# Patient Record
Sex: Male | Born: 1980 | Race: White | Hispanic: No | Marital: Single | Smoking: Former smoker
Health system: Southern US, Community
[De-identification: ages and names within clinical notes are randomized; demographics above are authoritative.]

## PROBLEM LIST (undated history)

## (undated) DIAGNOSIS — I10 Essential (primary) hypertension: Secondary | ICD-10-CM

## (undated) DIAGNOSIS — N189 Chronic kidney disease, unspecified: Secondary | ICD-10-CM

## (undated) HISTORY — PX: COCHLEAR IMPLANT: SUR684

---
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
## 2007-10-26 DIAGNOSIS — E291 Testicular hypofunction: Secondary | ICD-10-CM | POA: Insufficient documentation

## 2012-06-10 ENCOUNTER — Ambulatory Visit: Payer: Self-pay | Admitting: Unknown Physician Specialty

## 2014-03-30 HISTORY — PX: WISDOM TOOTH EXTRACTION: SHX21

## 2014-08-04 ENCOUNTER — Other Ambulatory Visit: Payer: Self-pay | Admitting: Rheumatology

## 2014-08-04 DIAGNOSIS — M25559 Pain in unspecified hip: Secondary | ICD-10-CM

## 2014-08-04 DIAGNOSIS — M533 Sacrococcygeal disorders, not elsewhere classified: Secondary | ICD-10-CM

## 2014-08-07 ENCOUNTER — Ambulatory Visit
Admission: RE | Admit: 2014-08-07 | Discharge: 2014-08-07 | Disposition: A | Discharge status: Home / Self Care | Payer: BLUE CROSS/BLUE SHIELD | Source: Ambulatory Visit | Attending: Rheumatology | Admitting: Rheumatology

## 2014-08-07 DIAGNOSIS — M533 Sacrococcygeal disorders, not elsewhere classified: Secondary | ICD-10-CM

## 2014-08-07 MED ORDER — IOHEXOL 300 MG/ML  SOLN
100.0000 mL | Freq: Once | INTRAMUSCULAR | Status: AC | PRN
  Administered 2014-08-07: 100 mL via INTRAVENOUS

## 2014-11-22 LAB — CBC AND DIFFERENTIAL
HCT: 43 % (ref 41–53)
HEMOGLOBIN: 14.9 g/dL (ref 13.5–17.5)
Neutrophils Absolute: 4 /uL
Platelets: 281 10*3/uL (ref 150–399)
WBC: 8.3 10^3/mL

## 2014-11-22 LAB — BASIC METABOLIC PANEL
BUN: 13 mg/dL (ref 4–21)
Creatinine: 1.1 mg/dL (ref 0.6–1.3)
Glucose: 95 mg/dL
Potassium: 4.6 mmol/L (ref 3.4–5.3)
SODIUM: 139 mmol/L (ref 137–147)

## 2014-11-22 LAB — LIPID PANEL
Cholesterol: 216 mg/dL — AB (ref 0–200)
HDL: 36 mg/dL (ref 35–70)
LDL Cholesterol: 141 mg/dL
LDL/HDL RATIO: 3.9
TRIGLYCERIDES: 195 mg/dL — AB (ref 40–160)

## 2014-11-22 LAB — HEPATIC FUNCTION PANEL
ALT: 52 U/L — AB (ref 10–40)
AST: 36 U/L (ref 14–40)
Alkaline Phosphatase: 119 U/L (ref 25–125)

## 2014-11-22 LAB — TSH: TSH: 2.36 u[IU]/mL (ref 0.41–5.90)

## 2015-02-08 DIAGNOSIS — G4730 Sleep apnea, unspecified: Secondary | ICD-10-CM | POA: Insufficient documentation

## 2015-02-08 DIAGNOSIS — J309 Allergic rhinitis, unspecified: Secondary | ICD-10-CM | POA: Insufficient documentation

## 2015-02-14 ENCOUNTER — Ambulatory Visit (INDEPENDENT_AMBULATORY_CARE_PROVIDER_SITE_OTHER): Payer: BLUE CROSS/BLUE SHIELD | Admitting: Family Medicine

## 2015-02-14 VITALS — Temp 98.6°F | Wt 204.0 lb

## 2015-02-14 DIAGNOSIS — Q8901 Asplenia (congenital): Secondary | ICD-10-CM | POA: Diagnosis not present

## 2015-02-14 DIAGNOSIS — Z23 Encounter for immunization: Secondary | ICD-10-CM | POA: Diagnosis not present

## 2015-02-14 NOTE — Progress Notes (Signed)
Patient ID: Jeremiah [name], male   DOB: Oct 20, 1980, 34 y.o.   MRN: [id number]   Jeremiah [name]  MRN: [id number] DOB: 05/01/1981  Subjective:  HPI   1. Asplenia Patient is a 34 year old male who  Presents today for an update of his immunizations.  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  He has already had 1 Pneumovax and 1 Menactra.  He is here today to Prevnar, Bexsero-Men B and Menveo,  He is afebrile and has not had any sign of illness or infection in the last week.  Patient Active Problem List   Diagnosis Date Noted  . Allergic rhinitis 02/08/2015  . Apnea, sleep 02/08/2015  . 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  Social History   Social History  . Marital Status: Single    Spouse Name: N/A  . Number of Children: N/A  . Years of Education: N/A   Occupational History  . Not on file.   Social History Main Topics  . Smoking status: Not on file  . Smokeless tobacco: Not on file  . Alcohol Use: Not on file  . Drug Use: Not on file  . Sexual Activity: Not on file   Other Topics Concern  . Not on file   Social History Narrative  . No narrative on file    Outpatient Prescriptions Prior to Visit  Medication Sig Dispense Refill  . fluticasone (FLONASE) 50 MCG/ACT nasal spray Place into the nose.    . loratadine (CLARITIN) 10 MG tablet Take by mouth.     No facility-administered medications prior to visit.    Allergies  Allergen Reactions  . Aspirin     Review of Systems  Constitutional: Negative for fever, chills and malaise/fatigue.  HENT: Negative for sore throat.   Respiratory: Negative for cough.   Cardiovascular: Negative for chest pain.  Gastrointestinal: Negative for abdominal pain.   Objective:  Temp(Src) 98.6 F (37 C)  Wt 204 lb (92.534 kg)  Physical Exam  Constitutional: He is well-developed, well-nourished, and in no distress.    Assessment and Plan :   1. Asplenia  - Meningococcal B, OMV -  Meningococcal conjugate vaccine 4-valent IM - Pneumococcal conjugate vaccine 13-valent IM  Patient is to receive Pneumovax in 1 year and repeat Meningitis in 5 years.   Julieanne Manson MD [facility] 02/14/2015 1:58 PM

## 2015-03-14 ENCOUNTER — Ambulatory Visit (INDEPENDENT_AMBULATORY_CARE_PROVIDER_SITE_OTHER): Payer: BLUE CROSS/BLUE SHIELD | Admitting: Family Medicine

## 2015-03-14 ENCOUNTER — Encounter: Payer: Self-pay | Admitting: Family Medicine

## 2015-03-14 VITALS — BP 122/72 | HR 84 | Temp 97.8°F | Resp 16 | Wt 203.0 lb

## 2015-03-14 DIAGNOSIS — L03221 Cellulitis of neck: Secondary | ICD-10-CM

## 2015-03-14 DIAGNOSIS — L255 Unspecified contact dermatitis due to plants, except food: Secondary | ICD-10-CM

## 2015-03-14 DIAGNOSIS — B029 Zoster without complications: Secondary | ICD-10-CM | POA: Diagnosis not present

## 2015-03-14 MED ORDER — MOMETASONE FUROATE 0.1 % EX CREA: 1.0000 "application " | TOPICAL_CREAM | Freq: Every day | CUTANEOUS | Status: DC

## 2015-03-14 MED ORDER — VALACYCLOVIR HCL 1 G PO TABS: 1000.0000 mg | ORAL_TABLET | Freq: Three times a day (TID) | ORAL | Status: DC

## 2015-03-14 MED ORDER — AMOXICILLIN 500 MG PO CAPS: ORAL_CAPSULE | ORAL | Status: DC

## 2015-03-14 NOTE — Progress Notes (Signed)
Patient ID: Jeremiah [name], male   DOB: 04-08-1981, 34 y.o.   MRN: [id number]    Subjective:  HPI  Patient is here for an acute issue. Patient states he developed a rash on the left side of his neck yesterday September 13. Rash is raised, red, and stings.  He has applied cortisone cream to the area.  Prior to Admission medications   Medication Sig Start Date End Date Taking? Authorizing Provider  fluticasone (FLONASE) 50 MCG/ACT nasal spray Place into the nose. 10/26/13  Yes Historical Provider, MD  loratadine (CLARITIN) 10 MG tablet Take by mouth.   Yes Historical Provider, MD    Patient Active Problem List   Diagnosis Date Noted  . Allergic rhinitis 02/08/2015  . Apnea, sleep 02/08/2015  . 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  Social History   Social History  . Marital Status: Single    Spouse Name: N/A  . Number of Children: N/A  . Years of Education: N/A   Occupational History  . Not on file.   Social History Main Topics  . Smoking status: Never Smoker   . Smokeless tobacco: Never Used  . Alcohol Use: No  . Drug Use: No  . Sexual Activity: Not on file   Other Topics Concern  . Not on file   Social History Narrative    Allergies  Allergen Reactions  . Aspirin     Review of Systems  Constitutional: Negative.   HENT: Positive for congestion.   Respiratory: Negative.   Cardiovascular: Negative.   Gastrointestinal: Negative.   Musculoskeletal: Negative.   Skin: Positive for rash.  Neurological: Negative.   Psychiatric/Behavioral: Negative.     Immunization History  Administered Date(s) Administered  . Meningococcal B, OMV 02/14/2015  . Meningococcal Conjugate 07/17/2010, 09/18/2010, 02/14/2015  . Pneumococcal Conjugate-13 02/14/2015  . Tdap 07/17/2010   Objective:  BP 122/72 mmHg  Pulse 84  Temp(Src) 97.8 F (36.6 C)  Resp 16  Wt 203 lb (92.08 kg)  Physical Exam  Constitutional: He is oriented to person,  place, and time and well-developed, well-nourished, and in no distress.  HENT:  Head: Normocephalic and atraumatic.  Right Ear: External ear normal.  Left Ear: External ear normal.  Nose: Nose normal.  Eyes: Conjunctivae are normal.  Neck: Neck supple.  Cardiovascular: Normal rate, regular rhythm and normal heart sounds.   Pulmonary/Chest: Effort normal and breath sounds normal.  Abdominal: Soft.  Neurological: He is alert and oriented to person, place, and time.  Skin: Skin is warm and dry.  Small, 1 cm x 0.5 cm area of dermatitis with 3 or 4 small vesicles. Mildly erythematous and scaling  Psychiatric: Mood, memory, affect and judgment normal.    Lab Results  Component Value Date   WBC 8.3 11/22/2014   HGB 14.9 11/22/2014   HCT 43 11/22/2014   PLT 281 11/22/2014   CHOL 216* 11/22/2014   TRIG 195* 11/22/2014   HDL 36 11/22/2014   LDLCALC 141 11/22/2014   TSH 2.36 11/22/2014    CMP     Component Value Date/Time   NA 139 11/22/2014   K 4.6 11/22/2014   BUN 13 11/22/2014   CREATININE 1.1 11/22/2014   AST 36 11/22/2014   ALT 52* 11/22/2014   ALKPHOS 119 11/22/2014    Assessment and Plan :  1. Rhus dermatitis Most likely - mometasone (ELOCON) 0.1 % cream; Apply 1 application topically daily.  Dispense: 15 g; Refill: 0  2. Herpes zoster Clinically this is not shingles. Only fil this prescription if symptoms more consistent with shingles - valACYclovir (VALTREX) 1000 MG tablet; Take 1 tablet (1,000 mg total) by mouth 3 (three) times daily.  Dispense: 21 tablet; Refill: 0  3. Cellulitis of neck This certainly does not appear to be an infection of any cannabis time. Only fill this prescription if he gets fever and worsening rash. He will call me if this happens. He is cover for this because of his asplenic condition - amoxicillin (AMOXIL) 500 MG capsule; 2 tablets twice daily  Dispense: 40 capsule; Refill: 0   Julieanne Manson MD [facility] 03/14/2015 8:48 AM

## 2015-03-28 ENCOUNTER — Telehealth: Payer: Self-pay | Admitting: Family Medicine

## 2015-03-28 NOTE — Telephone Encounter (Signed)
Per [facility] needs Pneumovax in 1 year and Meningitis in 5 years. She was wondering for Jeremiah [name] also and per Hettie Holstein looked like it was the same, but mother thought patient said in 1 month. Can you review this for me please to make sure I am telling them the right information, she said no need to call her back unless we find out this is not accurate, thank you-aa

## 2015-03-28 NOTE — Telephone Encounter (Signed)
I have discussed with the mother all the vaccines they are due and what vaccines we are missing from our documentation.  Jeremiah [name] is going to get me that information and we will go from there.  [employer]

## 2015-03-28 NOTE — Telephone Encounter (Signed)
Pt's mom Claris Gower would like a call back to advise when pt is supposed to return to get his next round of shots. She stated that he got then last on 02/14/15. Mom is on DPR.  Please advise. Thanks TNP

## 2015-04-18 ENCOUNTER — Telehealth: Payer: Self-pay | Admitting: Family Medicine

## 2015-04-18 NOTE — Telephone Encounter (Signed)
Pt mother called and request a call back from [place].  CB#[phone]/MW

## 2015-04-30 ENCOUNTER — Ambulatory Visit (INDEPENDENT_AMBULATORY_CARE_PROVIDER_SITE_OTHER): Payer: BLUE CROSS/BLUE SHIELD | Admitting: Family Medicine

## 2015-04-30 VITALS — Temp 99.3°F

## 2015-04-30 DIAGNOSIS — Z23 Encounter for immunization: Secondary | ICD-10-CM

## 2015-04-30 DIAGNOSIS — Q8901 Asplenia (congenital): Secondary | ICD-10-CM

## 2015-05-01 DIAGNOSIS — Q8901 Asplenia (congenital): Secondary | ICD-10-CM | POA: Diagnosis not present

## 2015-05-01 DIAGNOSIS — Z23 Encounter for immunization: Secondary | ICD-10-CM | POA: Diagnosis not present

## 2015-05-01 LAB — MEASLES/MUMPS/RUBELLA IMMUNITY
MUMPS ABS, IGG: 42.3 AU/mL (ref >10.9)
RUBELLA: 1.18 {index} (ref >0.99)

## 2015-05-01 LAB — VARICELLA ZOSTER ABS, IGG/IGM: Varicella zoster IgG: >4000 index (ref >165)

## 2015-06-05 ENCOUNTER — Ambulatory Visit (INDEPENDENT_AMBULATORY_CARE_PROVIDER_SITE_OTHER): Payer: BLUE CROSS/BLUE SHIELD | Admitting: Family Medicine

## 2015-06-05 VITALS — Temp 98.6°F

## 2015-06-05 DIAGNOSIS — Z23 Encounter for immunization: Secondary | ICD-10-CM | POA: Diagnosis not present

## 2015-07-26 NOTE — Progress Notes (Signed)
Vaccine needed

## 2015-12-20 DIAGNOSIS — Z45321 Encounter for adjustment and management of cochlear device: Secondary | ICD-10-CM | POA: Diagnosis not present

## 2015-12-20 DIAGNOSIS — H903 Sensorineural hearing loss, bilateral: Secondary | ICD-10-CM | POA: Diagnosis not present

## 2016-04-14 DIAGNOSIS — L400 Psoriasis vulgaris: Secondary | ICD-10-CM | POA: Diagnosis not present

## 2016-05-13 ENCOUNTER — Ambulatory Visit (INDEPENDENT_AMBULATORY_CARE_PROVIDER_SITE_OTHER): Payer: BLUE CROSS/BLUE SHIELD | Admitting: Family Medicine

## 2016-05-13 VITALS — BP 128/74 | HR 74 | Temp 98.6°F | Resp 18 | Wt 200.0 lb

## 2016-05-13 DIAGNOSIS — J329 Chronic sinusitis, unspecified: Secondary | ICD-10-CM | POA: Diagnosis not present

## 2016-05-13 MED ORDER — AMOXICILLIN-POT CLAVULANATE 875-125 MG PO TABS: 1.0000 | ORAL_TABLET | Freq: Two times a day (BID) | ORAL | 3 refills | Status: DC

## 2016-05-13 NOTE — Progress Notes (Signed)
Jeremiah [name]  MRN: [id number] DOB: 06/19/1981  Subjective:  HPI  The patient is a 35 year old male who presents for evaluation of cold symptoms.  He describes having a non productive cough, head congestion, fever no sore throat.  The cough is making his chest sorf and it is also keeping him up at night.  Patient Active Problem List   Diagnosis Date Noted  . Allergic rhinitis 02/08/2015  . Apnea, sleep 02/08/2015  . 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  Social History   Social History  . Marital status: Single    Spouse name: N/A  . Number of children: N/A  . Years of education: N/A   Occupational History  . Not on file.   Social History Main Topics  . Smoking status: Never Smoker  . Smokeless tobacco: Never Used  . Alcohol use No  . Drug use: No  . Sexual activity: Not on file   Other Topics Concern  . Not on file   Social History Narrative  . No narrative on file    Outpatient Encounter Prescriptions as of 05/13/2016  Medication Sig Note  . [DISCONTINUED] fluticasone (FLONASE) 50 MCG/ACT nasal spray Place into the nose. 02/08/2015: Received from: [facility]  . [DISCONTINUED] loratadine (CLARITIN) 10 MG tablet Take by mouth. 02/08/2015: Received from: [facility]  . [DISCONTINUED] mometasone (ELOCON) 0.1 % cream Apply 1 application topically daily.   . [DISCONTINUED] valACYclovir (VALTREX) 1000 MG tablet Take 1 tablet (1,000 mg total) by mouth 3 (three) times daily.   . [DISCONTINUED] amoxicillin (AMOXIL) 500 MG capsule 2 tablets twice daily    No facility-administered encounter medications on file as of 05/13/2016.     Allergies  Allergen Reactions  . Aspirin     Review of Systems  Constitutional: Positive for chills, diaphoresis, fever and malaise/fatigue.  HENT: Positive for congestion and hearing loss (chronic). Negative for ear discharge, ear pain, nosebleeds, sinus pain, sore  throat and tinnitus.   Eyes: Negative for blurred vision, double vision, photophobia, pain, discharge and redness.  Respiratory: Positive for cough. Negative for hemoptysis, sputum production, shortness of breath and wheezing.   Cardiovascular: Negative for chest pain, palpitations and orthopnea.  Neurological: Positive for dizziness and weakness. Negative for headaches.  Psychiatric/Behavioral: Negative.     Objective:  BP 128/74 (BP Location: Right Arm, Patient Position: Sitting, Cuff Size: Normal)   Pulse 74   Temp 98.6 F (37 C) (Oral)   Resp 18   Wt 200 lb (90.7 kg)   SpO2 96%   BMI 27.89 kg/m   Physical Exam  Constitutional: He is oriented to person, place, and time and well-developed, well-nourished, and in no distress.  HENT:  Head: Normocephalic and atraumatic.  Right Ear: External ear normal.  Left Ear: External ear normal.  Nose: Nose normal.  Mouth/Throat: Oropharynx is clear and moist. No oropharyngeal exudate.  Eyes: Conjunctivae are normal. No scleral icterus.  Neck: Neck supple. No thyromegaly present.  Cardiovascular: Normal rate, regular rhythm and normal heart sounds.   Pulmonary/Chest: Effort normal and breath sounds normal.  Abdominal: Soft. Bowel sounds are normal.  Lymphadenopathy:    He has no cervical adenopathy.  Neurological: He is alert and oriented to person, place, and time. Gait normal.  Skin: Skin is warm and dry. No rash noted.  Psychiatric: Mood, memory, affect and judgment normal.    Assessment and Plan :  Asplenia  Bronchitis Rx aggressively in  asplenic patient.Augmentin.  I have done the exam and reviewed the chart and it is accurate to the best of my knowledge. [employer]  technology has been used and  any errors in dictation or transcription are unintentional. Julieanne Mansonichard Shadeed Colberg M.D. [facility]

## 2016-06-03 DIAGNOSIS — Z23 Encounter for immunization: Secondary | ICD-10-CM | POA: Diagnosis not present

## 2016-07-22 DIAGNOSIS — L400 Psoriasis vulgaris: Secondary | ICD-10-CM | POA: Diagnosis not present

## 2016-08-06 ENCOUNTER — Telehealth: Payer: Self-pay | Admitting: Family Medicine

## 2016-08-06 NOTE — Telephone Encounter (Signed)
Patient has not been exposed and is not having any symptoms, mother was just concerned and asking questions. ED

## 2016-08-06 NOTE — Telephone Encounter (Signed)
Pt's mom Charlotte would like Jeremiah [name] to return her call. Charlotte is concerned with pt's immune system she would like to have an Rx for Tamaflu sent to [facility] just in case. Charlotte stated she would just feel better if there where to start showing signs the Rx would be at the pharmacy in case it was after clinic hours or over the weekend. Please advise. Thanks TNP   °

## 2016-10-16 ENCOUNTER — Ambulatory Visit (INDEPENDENT_AMBULATORY_CARE_PROVIDER_SITE_OTHER): Payer: BLUE CROSS/BLUE SHIELD | Admitting: Family Medicine

## 2016-10-16 ENCOUNTER — Encounter: Payer: Self-pay | Admitting: Family Medicine

## 2016-10-16 VITALS — BP 124/70 | HR 74 | Temp 97.8°F | Resp 16 | Ht 71.0 in | Wt 200.0 lb

## 2016-10-16 DIAGNOSIS — J301 Allergic rhinitis due to pollen: Secondary | ICD-10-CM | POA: Diagnosis not present

## 2016-10-16 DIAGNOSIS — Z0000 Encounter for general adult medical examination without abnormal findings: Secondary | ICD-10-CM

## 2016-10-16 DIAGNOSIS — Z23 Encounter for immunization: Secondary | ICD-10-CM | POA: Diagnosis not present

## 2016-10-16 DIAGNOSIS — Q8901 Asplenia (congenital): Secondary | ICD-10-CM

## 2016-10-16 DIAGNOSIS — R319 Hematuria, unspecified: Secondary | ICD-10-CM | POA: Diagnosis not present

## 2016-10-16 LAB — POCT URINALYSIS DIPSTICK
BILIRUBIN UA: NEGATIVE
Glucose, UA: NEGATIVE
KETONES UA: NEGATIVE
Leukocytes, UA: NEGATIVE
Nitrite, UA: NEGATIVE
PH UA: 5 (ref 5.0–8.0)
Protein, UA: 100
Spec Grav, UA: 1.02 (ref 1.010–1.025)
Urobilinogen, UA: 0.2 E.U./dL

## 2016-10-16 MED ORDER — MONTELUKAST SODIUM 10 MG PO TABS
10.0000 mg | ORAL_TABLET | Freq: Every day | ORAL | 12 refills | Status: DC
Start: 2016-10-16 — End: 2018-09-07

## 2016-10-16 MED ORDER — FLUTICASONE PROPIONATE 50 MCG/ACT NA SUSP
2.0000 | Freq: Every day | NASAL | 12 refills | Status: DC
Start: 2016-10-16 — End: 2020-10-01

## 2016-10-16 NOTE — Progress Notes (Signed)
Patient: Jeremiah [name], Male    DOB: Oct 03, 1980, 36 y.o.   MRN: [id number] Visit Date: 10/16/2016  Today's Provider: Wilhemena Durie, MD   Chief Complaint  Patient presents with  . Annual Exam   Subjective:    Annual physical exam Jeremiah [name] is a 36 y.o. male who presents today for health maintenance and complete physical. He feels well. He reports exercising at least once a week. He reports he is sleeping well.  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   Immunization History  Administered Date(s) Administered  . DTaP 05/08/1981, 08/13/1981, 10/26/1981, 11/21/1982, 02/12/1997  . Hepatitis A 04/30/2015  . Hepatitis B 04/30/2015  . Hepatitis B, adult 06/05/2015  . HiB (PRP-OMP) 07/14/1984  . IPV 05/08/1981, 08/13/1981, 10/26/1981, 11/21/1982, 02/12/1997  . Influenza,inj,Quad PF,36+ Mos 05/01/2015  . MMR 08/15/1982  . Meningococcal B, OMV 02/14/2015, 04/30/2015  . Meningococcal Conjugate 07/17/2010, 09/18/2010, 02/14/2015  . Pneumococcal Conjugate-13 02/14/2015  . Pneumococcal Polysaccharide-23 07/03/2005  . Tdap 07/17/2010     Review of Systems  Constitutional: Negative.   HENT: Negative.   Eyes: Negative.   Respiratory: Negative.   Cardiovascular: Negative.   Gastrointestinal: Negative.   Endocrine: Negative.   Genitourinary: Negative.   Musculoskeletal: Negative.   Skin: Negative.   Allergic/Immunologic: Positive for environmental allergies.  Neurological: Negative.   Hematological: Negative.   Psychiatric/Behavioral: Negative.     Social History      He  reports that he has never smoked. He has never used smokeless tobacco. He reports that he does not drink alcohol or use drugs.       Social History   Social History  . Marital status: Single    Spouse name: N/A  . Number of children: N/A  . Years of education: N/A   Social History Main Topics  . Smoking  status: Never Smoker  . Smokeless tobacco: Never Used  . Alcohol use No  . Drug use: No  . Sexual activity: Not Asked   Other Topics Concern  . None   Social History Narrative  . None    History reviewed. No pertinent past medical history.   Patient Active Problem List   Diagnosis Date Noted  . Allergic rhinitis 02/08/2015  . Apnea, sleep 02/08/2015  . 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 COCHLEAR IMPLANT     2000 and 2007  . SPLENECTOMY  1988  . WISDOM TOOTH EXTRACTION  03/2014    Family History        Family Status  Relation Status  . Mother Alive  . Father Alive  . Brother Alive        His family history includes Hyperlipidemia in his father.     Allergies  Allergen Reactions  . Aspirin     No current outpatient prescriptions on file.   Patient Care Team: Jerrol Banana., MD as PCP - General (Family Medicine)      Objective:   Vitals: BP 124/70 (BP Location: Left Arm, Patient Position: Sitting, Cuff Size: Large)   Pulse 74   Temp 97.8 F (36.6 C) (Oral)   Resp 16   Ht 5' 11"  (1.803 m)   Wt 200 lb (90.7 kg)   BMI 27.89 kg/m    Vitals:   10/16/16 0947  BP: 124/70  Pulse: 74  Resp: 16  Temp: 97.8 F (36.6 C)  TempSrc: Oral  Weight: 200 lb (90.7 kg)  Height: 5' 11"  (1.803 m)     Physical Exam  Constitutional: He is oriented to person, place, and time. He appears well-developed and well-nourished.  HENT:  Head: Normocephalic and atraumatic.  Right Ear: External ear normal.  Left Ear: External ear normal.  Nose: Nose normal.  Mouth/Throat: Oropharynx is clear and moist.  1+ tonsils. New goiter.  Eyes: Conjunctivae and EOM are normal. Pupils are equal, round, and reactive to light.  Neck: Normal range of motion. Neck supple.  Cardiovascular: Normal rate, regular rhythm, normal heart sounds and intact distal pulses.   Pulmonary/Chest: Effort normal and breath sounds normal.    Abdominal: Soft. Bowel sounds are normal.  Musculoskeletal: Normal range of motion.  Neurological: He is alert and oriented to person, place, and time. He has normal reflexes.  Skin: Skin is warm and dry.  Psychiatric: He has a normal mood and affect. His behavior is normal. Judgment and thought content normal.     Depression Screen PHQ 2/9 Scores 10/16/2016 03/14/2015  PHQ - 2 Score 0 0  PHQ- 9 Score 0 -      Assessment & Plan:     Routine Health Maintenance and Physical Exam  Exercise Activities and Dietary recommendations Goals    None      Immunization History  Administered Date(s) Administered  . DTaP 05/08/1981, 08/13/1981, 10/26/1981, 11/21/1982, 02/12/1997  . Hepatitis A 04/30/2015  . Hepatitis B 04/30/2015  . Hepatitis B, adult 06/05/2015  . HiB (PRP-OMP) 07/14/1984  . IPV 05/08/1981, 08/13/1981, 10/26/1981, 11/21/1982, 02/12/1997  . Influenza,inj,Quad PF,36+ Mos 05/01/2015  . MMR 08/15/1982  . Meningococcal B, OMV 02/14/2015, 04/30/2015  . Meningococcal Conjugate 07/17/2010, 09/18/2010, 02/14/2015  . Pneumococcal Conjugate-13 02/14/2015  . Pneumococcal Polysaccharide-23 07/03/2005  . Tdap 07/17/2010    Health Maintenance  Topic Date Due  . HIV Screening  03/08/1996  . INFLUENZA VACCINE  01/28/2017  . TETANUS/TDAP  07/17/2020     Discussed health benefits of physical activity, and encouraged him to engage in regular exercise appropriate for his age and condition.  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 Small Anal Fissure New Goiter Get thyroid [place] Restart CPAP.   --------------------------------------------------------------------    RWilhemena Durie MD  [facility]

## 2016-10-17 DIAGNOSIS — Z0000 Encounter for general adult medical examination without abnormal findings: Secondary | ICD-10-CM | POA: Diagnosis not present

## 2016-10-18 LAB — COMPREHENSIVE METABOLIC PANEL
ALBUMIN: 4.3 g/dL (ref 3.5–5.5)
ALK PHOS: 111 IU/L (ref 39–117)
ALT: 44 IU/L (ref 0–44)
AST: 38 IU/L (ref 0–40)
Albumin/Globulin Ratio: 1.7 (ref 1.2–2.2)
BUN / CREAT RATIO: 12 (ref 9–20)
BUN: 13 mg/dL (ref 6–20)
Bilirubin Total: 0.4 mg/dL (ref 0.0–1.2)
CO2: 23 mmol/L (ref 18–29)
CREATININE: 1.1 mg/dL (ref 0.76–1.27)
Calcium: 9.1 mg/dL (ref 8.7–10.2)
Chloride: 101 mmol/L (ref 96–106)
GFR calc Af Amer: 100 mL/min/{1.73_m2} (ref >59)
GFR calc non Af Amer: 87 mL/min/{1.73_m2} (ref >59)
GLUCOSE: 97 mg/dL (ref 65–99)
Globulin, Total: 2.6 g/dL (ref 1.5–4.5)
Potassium: 4.3 mmol/L (ref 3.5–5.2)
Sodium: 139 mmol/L (ref 134–144)
Total Protein: 6.9 g/dL (ref 6.0–8.5)

## 2016-10-18 LAB — LIPID PANEL WITH LDL/HDL RATIO
CHOLESTEROL TOTAL: 215 mg/dL — AB (ref 100–199)
HDL: 29 mg/dL — ABNORMAL LOW (ref >39)
LDL CALC: 140 mg/dL — AB (ref 0–99)
LDl/HDL Ratio: 4.8 ratio — ABNORMAL HIGH (ref 0.0–3.6)
Triglycerides: 231 mg/dL — ABNORMAL HIGH (ref 0–149)
VLDL Cholesterol Cal: 46 mg/dL — ABNORMAL HIGH (ref 5–40)

## 2016-10-18 LAB — CBC WITH DIFFERENTIAL/PLATELET
BASOS ABS: 0.1 10*3/uL (ref 0.0–0.2)
Basos: 1 %
EOS (ABSOLUTE): 0.4 10*3/uL (ref 0.0–0.4)
Eos: 4 %
Hematocrit: 40.2 % (ref 37.5–51.0)
Hemoglobin: 14.6 g/dL (ref 13.0–17.7)
Immature Grans (Abs): 0 10*3/uL (ref 0.0–0.1)
Immature Granulocytes: 0 %
LYMPHS ABS: 2.3 10*3/uL (ref 0.7–3.1)
Lymphs: 24 %
MCH: 30.2 pg (ref 26.6–33.0)
MCHC: 36.3 g/dL — AB (ref 31.5–35.7)
MCV: 83 fL (ref 79–97)
Monocytes Absolute: 1.3 10*3/uL — ABNORMAL HIGH (ref 0.1–0.9)
Monocytes: 13 %
NEUTROS ABS: 5.5 10*3/uL (ref 1.4–7.0)
Neutrophils: 58 %
PLATELETS: 270 10*3/uL (ref 150–379)
RBC: 4.83 x10E6/uL (ref 4.14–5.80)
RDW: 14.6 % (ref 12.3–15.4)
WBC: 9.5 10*3/uL (ref 3.4–10.8)

## 2016-10-18 LAB — TSH: TSH: 2.17 u[IU]/mL (ref 0.450–4.500)

## 2016-10-23 NOTE — Progress Notes (Signed)
Advised  ED 

## 2016-10-31 LAB — POCT UA - MICROSCOPIC ONLY

## 2016-11-12 ENCOUNTER — Ambulatory Visit (INDEPENDENT_AMBULATORY_CARE_PROVIDER_SITE_OTHER): Payer: BLUE CROSS/BLUE SHIELD | Admitting: Physician Assistant

## 2016-11-12 ENCOUNTER — Encounter: Payer: Self-pay | Admitting: Physician Assistant

## 2016-11-12 VITALS — BP 112/72 | HR 92 | Temp 98.3°F | Resp 16 | Wt 199.0 lb

## 2016-11-12 DIAGNOSIS — Q8901 Asplenia (congenital): Secondary | ICD-10-CM | POA: Diagnosis not present

## 2016-11-12 DIAGNOSIS — J029 Acute pharyngitis, unspecified: Secondary | ICD-10-CM

## 2016-11-12 DIAGNOSIS — R509 Fever, unspecified: Secondary | ICD-10-CM

## 2016-11-12 LAB — POCT INFLUENZA A/B
Influenza A, POC: NEGATIVE
Influenza B, POC: NEGATIVE

## 2016-11-12 LAB — POCT RAPID STREP A (OFFICE): Rapid Strep A Screen: NEGATIVE

## 2016-11-12 MED ORDER — AMOXICILLIN-POT CLAVULANATE 875-125 MG PO TABS: 1.0000 | ORAL_TABLET | Freq: Two times a day (BID) | ORAL | 0 refills | Status: AC

## 2016-11-12 NOTE — Progress Notes (Signed)
Patient: Jeremiah [name] Male    DOB: [birth year]   36 y.o.   MRN: [id number] Visit Date: 11/12/2016  Today's Provider: Trey Sailors, PA-C   Chief Complaint  Patient presents with  . Sore Throat    Started Last night.  . Fever    101.4 today   Subjective:    Sore Throat   This is a new problem. The current episode started today. The problem has been unchanged. Neither side of throat is experiencing more pain than the other. The maximum temperature recorded prior to his arrival was 101 - 101.9 F. The fever has been present for less than 1 day. Associated symptoms include headaches and trouble swallowing. Pertinent negatives include no abdominal pain, congestion, coughing, diarrhea, drooling, ear discharge, ear pain, hoarse voice, plugged ear sensation, neck pain, shortness of breath, stridor, swollen glands or vomiting. He has tried NSAIDs for the symptoms. The treatment provided mild relief.  Fever   This is a new problem. The current episode started today. The maximum temperature noted was 101 to 101.9 F. Associated symptoms include headaches and a sore throat. Pertinent negatives include no abdominal pain, congestion, coughing, diarrhea, ear pain or vomiting. He has tried NSAIDs for the symptoms. The treatment provided moderate relief.   Jeremiah [name] is 36 y/o asplenic male with above symptoms. Not currently febrile in office, but has taken four 200 mg ibuprofen pills since noon today. Has had mono before very remotely.     Allergies  Allergen Reactions  . Aspirin      Current Outpatient Prescriptions:  .  fluticasone (FLONASE) 50 MCG/ACT nasal spray, Place 2 sprays into both nostrils daily., Disp: 15 g, Rfl: 12 .  montelukast (SINGULAIR) 10 MG tablet, Take 1 tablet (10 mg total) by mouth at bedtime., Disp: 30 tablet, Rfl: 12  Review of Systems  Constitutional: Positive for chills, diaphoresis, fatigue and fever. Negative for activity change, appetite change and  unexpected weight change.  HENT: Positive for sore throat and trouble swallowing. Negative for congestion, drooling, ear discharge, ear pain, hoarse voice, nosebleeds, postnasal drip, rhinorrhea, sinus pain, sinus pressure, tinnitus and voice change.   Eyes: Negative.   Respiratory: Negative.  Negative for cough, shortness of breath and stridor.   Cardiovascular: Negative.   Gastrointestinal: Negative.  Negative for abdominal pain, diarrhea and vomiting.  Musculoskeletal: Positive for arthralgias, myalgias and neck stiffness. Negative for back pain, gait problem, joint swelling and neck pain.  Neurological: Positive for headaches. Negative for dizziness and light-headedness.    Social History  Substance Use Topics  . Smoking status: Never Smoker  . Smokeless tobacco: Never Used  . Alcohol use No   Objective:   BP 112/72 (BP Location: Right Arm, Patient Position: Sitting, Cuff Size: Normal)   Pulse 92   Temp 98.3 F (36.8 C) (Oral)   Resp 16   Wt 199 lb (90.3 kg)   BMI 27.75 kg/m  Vitals:   11/12/16 1500  BP: 112/72  Pulse: 92  Resp: 16  Temp: 98.3 F (36.8 C)  TempSrc: Oral  Weight: 199 lb (90.3 kg)     Physical Exam  Constitutional: He appears well-developed and well-nourished. He appears ill. No distress.  HENT:  Right Ear: External ear normal. Tympanic membrane is not erythematous and not bulging.  Left Ear: Tympanic membrane and external ear normal. Tympanic membrane is not erythematous and not bulging.  Nose: Mucosal edema present.  Mouth/Throat: Posterior oropharyngeal edema and  posterior oropharyngeal erythema present. No oropharyngeal exudate or tonsillar abscesses.  Right erythematous ear canal but not TM. Cochlear implant on left side.   Eyes: Conjunctivae are normal.  Cardiovascular: Normal rate and regular rhythm.   Pulmonary/Chest: Effort normal and breath sounds normal.  Lymphadenopathy:    He has cervical adenopathy.  Skin: Skin is warm and dry.    Psychiatric: He has a normal mood and affect. His behavior is normal.        Assessment & Plan:     1. Pharyngitis, unspecified etiology  Rapid flu and strep negative. Will treat as below 2/2 asplenia. Wrote work note for next two days. Call back if not improving.  - amoxicillin-clavulanate (AUGMENTIN) 875-125 MG tablet; Take 1 tablet by mouth 2 (two) times daily.  Dispense: 14 tablet; Refill: 0 - POCT Influenza A/B - POCT rapid strep A  2. Asplenia  - amoxicillin-clavulanate (AUGMENTIN) 875-125 MG tablet; Take 1 tablet by mouth 2 (two) times daily.  Dispense: 14 tablet; Refill: 0  3. Fever, unspecified fever cause  - amoxicillin-clavulanate (AUGMENTIN) 875-125 MG tablet; Take 1 tablet by mouth 2 (two) times daily.  Dispense: 14 tablet; Refill: 0 - POCT Influenza A/B - POCT rapid strep A  Return if symptoms worsen or fail to improve.  The entirety of the information documented in the History of Present Illness, Review of Systems and Physical Exam were personally obtained by me. Portions of this information were initially documented by Kavin LeechLaura Walsh, CMA and reviewed by me for thoroughness and accuracy.          Trey SailorsAdriana M Pollak, PA-C  [facility]

## 2016-11-12 NOTE — Patient Instructions (Signed)

## 2016-11-17 ENCOUNTER — Encounter: Payer: Self-pay | Admitting: Family Medicine

## 2016-11-17 ENCOUNTER — Ambulatory Visit (INDEPENDENT_AMBULATORY_CARE_PROVIDER_SITE_OTHER): Payer: BLUE CROSS/BLUE SHIELD | Admitting: Family Medicine

## 2016-11-17 VITALS — BP 142/72 | HR 96 | Temp 98.3°F | Resp 16 | Wt 196.0 lb

## 2016-11-17 DIAGNOSIS — H6690 Otitis media, unspecified, unspecified ear: Secondary | ICD-10-CM | POA: Diagnosis not present

## 2016-11-17 DIAGNOSIS — M791 Myalgia, unspecified site: Secondary | ICD-10-CM

## 2016-11-17 MED ORDER — DOXYCYCLINE HYCLATE 100 MG PO TABS: 100.0000 mg | ORAL_TABLET | Freq: Two times a day (BID) | ORAL | 0 refills | Status: DC

## 2016-11-17 NOTE — Progress Notes (Signed)
       Patient: Jeremiah NettlesJacob H Iott Male    DOB: 04/09/1981   36 y.o.   MRN: [id number] Visit Date: 11/17/2016  Today's Provider: Megan Mansichard Gilbert Jr, MD   Chief Complaint  Patient presents with  . Ear Pain   Subjective:    HPI Patient comes in today c/o right ear pain. He reports that this has been ongoing for over 1 week. Patient was seen in the office last week and he was prescribed Augmentin.  He was also tested negative for strep and flu. He currently has 2 doses left. Patient reports that he has associated body aches and jaw pain. He has also been taking OTC sudafed along with his daily allergy medication.     Allergies  Allergen Reactions  . Aspirin      Current Outpatient Prescriptions:  .  amoxicillin-clavulanate (AUGMENTIN) 875-125 MG tablet, Take 1 tablet by mouth 2 (two) times daily., Disp: 14 tablet, Rfl: 0 .  fluticasone (FLONASE) 50 MCG/ACT nasal spray, Place 2 sprays into both nostrils daily., Disp: 15 g, Rfl: 12 .  montelukast (SINGULAIR) 10 MG tablet, Take 1 tablet (10 mg total) by mouth at bedtime., Disp: 30 tablet, Rfl: 12  Review of Systems  Constitutional: Positive for fatigue.  HENT: Positive for ear pain.   Respiratory: Negative.   Cardiovascular: Negative.   Musculoskeletal: Positive for myalgias.  Skin: Negative.   Allergic/Immunologic: Negative.   Hematological: Negative.   Psychiatric/Behavioral: Negative.     Social History  Substance Use Topics  . Smoking status: Never Smoker  . Smokeless tobacco: Never Used  . Alcohol use No   Objective:   BP (!) 142/72 (BP Location: Right Arm, Patient Position: Sitting, Cuff Size: Normal) Comment: pt has been taking Sudafed  Pulse 96   Temp 98.3 F (36.8 C)   Resp 16   Wt 196 lb (88.9 kg)   SpO2 99%   BMI 27.34 kg/m  Vitals:   11/17/16 1607  BP: (!) 142/72  Pulse: 96  Resp: 16  Temp: 98.3 F (36.8 C)  SpO2: 99%  Weight: 196 lb (88.9 kg)     Physical Exam  Constitutional: He is oriented to  person, place, and time. He appears well-developed and well-nourished.  HENT:  Head: Normocephalic and atraumatic.  Right Ear: Tympanic membrane is bulging.  Left Ear: External ear normal.  Nose: Nose normal.  Mouth/Throat: Oropharynx is clear and moist.  Neck: Normal range of motion.  Cardiovascular: Normal rate, regular rhythm and normal heart sounds.   Pulmonary/Chest: Effort normal and breath sounds normal.  Musculoskeletal: He exhibits tenderness.  Neurological: He is alert and oriented to person, place, and time.  Skin: Skin is warm and dry.  Psychiatric: He has a normal mood and affect. His behavior is normal. Judgment and thought content normal.        Assessment & Plan:     1. Myalgia Will change abx due to possible RMSF. Call if symptoms worsen.  - doxycycline (VIBRA-TABS) 100 MG tablet; Take 1 tablet (100 mg total) by mouth 2 (two) times daily.  Dispense: 14 tablet; Refill: 0  2. Acute otitis media, unspecified otitis media type  - doxycycline (VIBRA-TABS) 100 MG tablet; Take 1 tablet (100 mg total) by mouth 2 (two) times daily.  Dispense: 14 tablet; Refill: 0 3.Asplenia       Megan Mansichard Gilbert Jr, MD  [facility]

## 2016-11-19 ENCOUNTER — Telehealth: Payer: Self-pay | Admitting: Family Medicine

## 2016-11-19 DIAGNOSIS — R5081 Fever presenting with conditions classified elsewhere: Secondary | ICD-10-CM

## 2016-11-19 DIAGNOSIS — H9201 Otalgia, right ear: Secondary | ICD-10-CM

## 2016-11-19 NOTE — Telephone Encounter (Signed)
Mother advised. Also advised mother per Dr Sullivan LoneGilbert patient should take Augmentin and Doxy together and finish them both. patient will get lab work done today, and mother advised to let us know if patient gets worse or not better.=aa

## 2016-11-19 NOTE — Telephone Encounter (Signed)
Pt mom called states pt id have ear pain, body aches and a fever 99.7 this morning at 4am.  Mom is asking if pt will need to have lab work done. CB#[phone]/MW

## 2016-11-19 NOTE — Telephone Encounter (Signed)
Please review. Patient was seen on 11/17/16 and was put on Doxy tablets.-aa

## 2016-11-19 NOTE — Telephone Encounter (Signed)
Get CBC

## 2016-11-20 ENCOUNTER — Ambulatory Visit
Admission: RE | Admit: 2016-11-20 | Discharge: 2016-11-20 | Disposition: A | Discharge status: Home / Self Care | Payer: BLUE CROSS/BLUE SHIELD | Source: Ambulatory Visit | Attending: Family Medicine | Admitting: Family Medicine

## 2016-11-20 ENCOUNTER — Ambulatory Visit (INDEPENDENT_AMBULATORY_CARE_PROVIDER_SITE_OTHER): Payer: BLUE CROSS/BLUE SHIELD | Admitting: Family Medicine

## 2016-11-20 ENCOUNTER — Encounter: Payer: Self-pay | Admitting: Family Medicine

## 2016-11-20 ENCOUNTER — Telehealth: Payer: Self-pay

## 2016-11-20 VITALS — BP 126/74 | HR 84 | Temp 97.9°F | Resp 16 | Wt 196.0 lb

## 2016-11-20 DIAGNOSIS — R05 Cough: Secondary | ICD-10-CM

## 2016-11-20 DIAGNOSIS — R509 Fever, unspecified: Secondary | ICD-10-CM | POA: Diagnosis not present

## 2016-11-20 DIAGNOSIS — R059 Cough, unspecified: Secondary | ICD-10-CM

## 2016-11-20 DIAGNOSIS — R319 Hematuria, unspecified: Secondary | ICD-10-CM | POA: Diagnosis not present

## 2016-11-20 LAB — CBC WITH DIFFERENTIAL/PLATELET
BASOS: 0 %
Basophils Absolute: 0.1 10*3/uL (ref 0.0–0.2)
EOS (ABSOLUTE): 0.2 10*3/uL (ref 0.0–0.4)
Eos: 1 %
Hematocrit: 39.5 % (ref 37.5–51.0)
Hemoglobin: 13.9 g/dL (ref 13.0–17.7)
IMMATURE GRANS (ABS): 0.3 10*3/uL — AB (ref 0.0–0.1)
Immature Granulocytes: 2 %
LYMPHS: 14 %
Lymphocytes Absolute: 2.2 10*3/uL (ref 0.7–3.1)
MCH: 29.4 pg (ref 26.6–33.0)
MCHC: 35.2 g/dL (ref 31.5–35.7)
MCV: 84 fL (ref 79–97)
MONOCYTES: 13 %
Monocytes Absolute: 2 10*3/uL — ABNORMAL HIGH (ref 0.1–0.9)
Neutrophils Absolute: 11 10*3/uL — ABNORMAL HIGH (ref 1.4–7.0)
Neutrophils: 70 %
PLATELETS: 405 10*3/uL — AB (ref 150–379)
RBC: 4.73 x10E6/uL (ref 4.14–5.80)
RDW: 14.7 % (ref 12.3–15.4)
WBC: 15.8 10*3/uL — ABNORMAL HIGH (ref 3.4–10.8)

## 2016-11-20 MED ORDER — CEFTRIAXONE SODIUM 500 MG IJ SOLR
500.0000 mg | Freq: Once | INTRAMUSCULAR | Status: AC
  Administered 2016-11-20: 500 mg via INTRAMUSCULAR

## 2016-11-20 MED ORDER — CEFTRIAXONE SODIUM 1 G IJ SOLR: 1.0000 g | Freq: Once | INTRAMUSCULAR | Status: DC

## 2016-11-20 NOTE — Patient Instructions (Addendum)
Alternate Advil and Tylenol. Get labs tomorrow.  Push fluids.

## 2016-11-20 NOTE — Telephone Encounter (Signed)
Patient's mother advised and will come in at 11:45.

## 2016-11-20 NOTE — Telephone Encounter (Signed)
White count elevated some. Can eat come see me at either 1145 or 1:15

## 2016-11-20 NOTE — Telephone Encounter (Signed)
Please review labs? Thanks! 

## 2016-11-20 NOTE — Progress Notes (Signed)
       Patient: Jeremiah [name] Male    DOB: 05/30/1981   35 y.o.   MRN: [id number] Visit Date: 11/20/2016  Today's Provider: Megan Mansichard Shaundrea Carrigg Jr, MD   Chief Complaint  Patient presents with  . Follow-up   Subjective:    HPI Patient comes in today for a follow up. Patient was seen in the office on 11/17/16 (3 days ago) c/o muscle aches, jaw pain, and URI symptoms. Patient reports that he had a fever up to 100 last night. He has been taking Advil along with the Doxycycline that was prescribed with mild relief. Patient's labs indicated that his WBC's were elevated.     Allergies  Allergen Reactions  . Aspirin      Current Outpatient Prescriptions:  .  doxycycline (VIBRA-TABS) 100 MG tablet, Take 1 tablet (100 mg total) by mouth 2 (two) times daily., Disp: 14 tablet, Rfl: 0 .  fluticasone (FLONASE) 50 MCG/ACT nasal spray, Place 2 sprays into both nostrils daily., Disp: 15 g, Rfl: 12 .  montelukast (SINGULAIR) 10 MG tablet, Take 1 tablet (10 mg total) by mouth at bedtime., Disp: 30 tablet, Rfl: 12  Review of Systems  Social History  Substance Use Topics  . Smoking status: Never Smoker  . Smokeless tobacco: Never Used  . Alcohol use No   Objective:   BP 126/74 (BP Location: Left Arm, Patient Position: Sitting)   Pulse 84   Temp 97.9 F (36.6 C)   Resp 16   Wt 196 lb (88.9 kg)   SpO2 98%   BMI 27.34 kg/m  Vitals:   11/20/16 1158  BP: 126/74  Pulse: 84  Resp: 16  Temp: 97.9 F (36.6 C)  SpO2: 98%  Weight: 196 lb (88.9 kg)     Physical Exam  Constitutional: He appears well-developed and well-nourished.  HENT:  Head: Normocephalic and atraumatic.  Right Ear: External ear normal.  Left Ear: External ear normal.  Nose: Nose normal.  Mouth/Throat: Oropharynx is clear and moist.  Eyes:  Lateral left eye has erythema.   Cardiovascular: Normal rate, regular rhythm and normal heart sounds.   Pulmonary/Chest: Effort normal and breath sounds normal.  Skin: Skin is warm  and dry. No rash noted.        Assessment & Plan:     1. Febrile illness Administered a total of 1GM of Rocephin. Patient tolerated well.  - CK - cefTRIAXone (ROCEPHIN) injection 500 mg; Inject 500 mg into the muscle once. - cefTRIAXone (ROCEPHIN) injection 500 mg; Inject 500 mg into the muscle once. Clinically improving. 2. Cough  - CBC with Differential/Platelet - DG Chest 2 View; Future  3. Hematuria, unspecified type  - Urine culture - POCT urinalysis dipstick 4.Asplenia  Megan Mansichard Genevra Orne Jr, MD  [facility]

## 2016-11-20 NOTE — Telephone Encounter (Signed)
Patient's mother is requesting lab results. Mrs. Claris GowerCharlotte reports that pt is still complaining of muscle pain. Mrs. 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 CB# [phone].

## 2016-11-21 ENCOUNTER — Telehealth: Payer: Self-pay | Admitting: Family Medicine

## 2016-11-21 DIAGNOSIS — R509 Fever, unspecified: Secondary | ICD-10-CM | POA: Diagnosis not present

## 2016-11-21 DIAGNOSIS — R05 Cough: Secondary | ICD-10-CM | POA: Diagnosis not present

## 2016-11-21 NOTE — Telephone Encounter (Signed)
Would try Delsym first for cough

## 2016-11-21 NOTE — Telephone Encounter (Signed)
Pt mom called to ask if pt can get a Rx to help with his cough at night.  [facility]  CB#[phone]/MW  This is a pt of Dr Sullivan LoneGilbert and was seen in the office yesterday.

## 2016-11-21 NOTE — Telephone Encounter (Signed)
Please review-aa 

## 2016-11-21 NOTE — Telephone Encounter (Signed)
Mother advised-aa 

## 2016-11-22 ENCOUNTER — Ambulatory Visit: Payer: Self-pay | Admitting: Family Medicine

## 2016-11-22 LAB — PLEASE NOTE

## 2016-11-22 LAB — URINE CULTURE: ORGANISM ID, BACTERIA: NO GROWTH

## 2016-11-22 LAB — CBC WITH DIFFERENTIAL/PLATELET
BASOS ABS: 0 10*3/uL (ref 0.0–0.2)
Basos: 0 %
EOS (ABSOLUTE): 0.3 10*3/uL (ref 0.0–0.4)
Eos: 2 %
HEMOGLOBIN: 13.6 g/dL (ref 13.0–17.7)
Hematocrit: 39.9 % (ref 37.5–51.0)
Immature Grans (Abs): 0.1 10*3/uL (ref 0.0–0.1)
Immature Granulocytes: 1 %
LYMPHS ABS: 1.9 10*3/uL (ref 0.7–3.1)
LYMPHS: 12 %
MCH: 29.4 pg (ref 26.6–33.0)
MCHC: 34.1 g/dL (ref 31.5–35.7)
MCV: 86 fL (ref 79–97)
MONOCYTES: 9 %
Monocytes Absolute: 1.4 10*3/uL — ABNORMAL HIGH (ref 0.1–0.9)
NEUTROS ABS: 12.4 10*3/uL — AB (ref 1.4–7.0)
Neutrophils: 76 %
PLATELETS: 469 10*3/uL — AB (ref 150–379)
RBC: 4.63 x10E6/uL (ref 4.14–5.80)
RDW: 14.9 % (ref 12.3–15.4)
WBC: 16.2 10*3/uL — ABNORMAL HIGH (ref 3.4–10.8)

## 2016-11-22 LAB — CK: Total CK: 341 U/L — ABNORMAL HIGH (ref 24–204)

## 2016-11-26 LAB — POCT URINALYSIS DIPSTICK
Bilirubin, UA: NEGATIVE
GLUCOSE UA: NEGATIVE
Ketones, UA: NEGATIVE
Nitrite, UA: NEGATIVE
PROTEIN UA: NEGATIVE
Spec Grav, UA: 1.02 (ref 1.010–1.025)
UROBILINOGEN UA: 0.2 U/dL
pH, UA: 6 (ref 5.0–8.0)

## 2016-12-01 DIAGNOSIS — M722 Plantar fascial fibromatosis: Secondary | ICD-10-CM | POA: Insufficient documentation

## 2016-12-19 DIAGNOSIS — Z9621 Cochlear implant status: Secondary | ICD-10-CM | POA: Diagnosis not present

## 2016-12-19 DIAGNOSIS — H903 Sensorineural hearing loss, bilateral: Secondary | ICD-10-CM | POA: Diagnosis not present

## 2016-12-23 ENCOUNTER — Ambulatory Visit: Payer: BLUE CROSS/BLUE SHIELD | Admitting: Family Medicine

## 2016-12-23 ENCOUNTER — Encounter: Payer: Self-pay | Admitting: Family Medicine

## 2016-12-23 ENCOUNTER — Ambulatory Visit (INDEPENDENT_AMBULATORY_CARE_PROVIDER_SITE_OTHER): Payer: BLUE CROSS/BLUE SHIELD | Admitting: Family Medicine

## 2016-12-23 VITALS — BP 120/76 | HR 78 | Temp 98.5°F | Resp 16 | Wt 197.0 lb

## 2016-12-23 DIAGNOSIS — N410 Acute prostatitis: Secondary | ICD-10-CM | POA: Diagnosis not present

## 2016-12-23 DIAGNOSIS — R319 Hematuria, unspecified: Secondary | ICD-10-CM | POA: Diagnosis not present

## 2016-12-23 LAB — POCT URINALYSIS DIPSTICK
Bilirubin, UA: NEGATIVE
GLUCOSE UA: NEGATIVE
KETONES UA: NEGATIVE
Nitrite, UA: NEGATIVE
SPEC GRAV UA: 1.015 (ref 1.010–1.025)
UROBILINOGEN UA: 0.2 U/dL
pH, UA: 6 (ref 5.0–8.0)

## 2016-12-23 MED ORDER — DOXYCYCLINE HYCLATE 100 MG PO TABS: 100.0000 mg | ORAL_TABLET | Freq: Two times a day (BID) | ORAL | 0 refills | Status: DC

## 2016-12-23 NOTE — Progress Notes (Signed)
       Patient: Jeremiah NettlesJacob H Flavell Male    DOB: 01/29/1981   36 y.o.   MRN: [id number] Visit Date: 12/23/2016  Today's Provider: Megan Mansichard Tiwan Schnitker Jr, MD   Chief Complaint  Patient presents with  . Hematuria    1 month follow up   Subjective:    HPI Hematuria:  Patient was last seen for this problem 1 months ago.Management during that visit includes ordering labs and administering Rocephin injection in the office. Patient comes in today reporting that he feels better and has not had any fevers or seen any blood in his urine. Asymptomatic.    Allergies  Allergen Reactions  . Aspirin      Current Outpatient Prescriptions:  .  fluticasone (FLONASE) 50 MCG/ACT nasal spray, Place 2 sprays into both nostrils daily., Disp: 15 g, Rfl: 12 .  montelukast (SINGULAIR) 10 MG tablet, Take 1 tablet (10 mg total) by mouth at bedtime., Disp: 30 tablet, Rfl: 12  Review of Systems  Constitutional: Negative for appetite change, chills and fever.  Respiratory: Negative for chest tightness, shortness of breath and wheezing.   Cardiovascular: Negative for chest pain and palpitations.  Gastrointestinal: Negative for abdominal pain, nausea and vomiting.    Social History  Substance Use Topics  . Smoking status: Current Some Day Smoker  . Smokeless tobacco: Never Used  . Alcohol use No   Objective:   BP 120/76 (BP Location: Right Arm, Patient Position: Sitting, Cuff Size: Large)   Pulse 78   Temp 98.5 F (36.9 C) (Oral)   Resp 16   Wt 197 lb (89.4 kg)   SpO2 99% Comment: room air  BMI 27.48 kg/m  There were no vitals filed for this visit.   Physical Exam  Constitutional: He is oriented to person, place, and time. He appears well-developed and well-nourished.  HENT:  Head: Normocephalic.  Cardiovascular: Normal rate, regular rhythm and normal heart sounds.   Pulmonary/Chest: Effort normal.  Neurological: He is alert and oriented to person, place, and time.  Skin: Skin is warm and dry.    Psychiatric: He has a normal mood and affect. His behavior is normal. Judgment and thought content normal.        Assessment & Plan:     1. Hematuria, unspecified type Urology referral if this persists on next OV. - POCT Urinalysis Dipstick--3-5 RBC,10-15 WBC per HPF.  2. Acute prostatitis Discussed safe sex.      I have done the exam and reviewed the above chart and it is accurate to the best of my knowledge. DentistDragon  technology has been used in this note in any air is in the dictation or transcription are unintentional.  Megan Mansichard Remmie Bembenek Jr, MD  [facility]

## 2016-12-24 ENCOUNTER — Ambulatory Visit: Payer: BLUE CROSS/BLUE SHIELD | Admitting: Family Medicine

## 2017-01-26 ENCOUNTER — Telehealth: Payer: Self-pay | Admitting: Family Medicine

## 2017-01-26 NOTE — Telephone Encounter (Signed)
Pt mom Jeremiah [name] is requesting a call back to discuss pt appointment.  CB#[phone]/MW

## 2017-02-24 ENCOUNTER — Ambulatory Visit (INDEPENDENT_AMBULATORY_CARE_PROVIDER_SITE_OTHER): Payer: BLUE CROSS/BLUE SHIELD | Admitting: Family Medicine

## 2017-02-24 VITALS — BP 122/82 | HR 60 | Temp 97.9°F | Resp 16 | Wt 193.0 lb

## 2017-02-24 DIAGNOSIS — R319 Hematuria, unspecified: Secondary | ICD-10-CM | POA: Diagnosis not present

## 2017-02-24 LAB — POCT URINALYSIS DIPSTICK
Bilirubin, UA: NEGATIVE
GLUCOSE UA: NEGATIVE
KETONES UA: NEGATIVE
Leukocytes, UA: NEGATIVE
Nitrite, UA: NEGATIVE
Protein, UA: NEGATIVE
SPEC GRAV UA: 1.015 (ref 1.010–1.025)
UROBILINOGEN UA: 0.2 U/dL
pH, UA: 6 (ref 5.0–8.0)

## 2017-02-24 NOTE — Progress Notes (Signed)
   Jeremiah [name]  MRN: [id number] DOB: 11/19/1980  Subjective:  HPI  The patient is a 36 year old male who presents for follow up of hematuria.  He was last seen on 12/23/16 and had hematuria.  He was given Doxycycline and states that he is feeling well.  He has not seen any blood in the urine and said his energy level is good. He has no c/o. No hematuria. He is sexually active with one partner. No issues.  Patient Active Problem List   Diagnosis Date Noted  . Plantar fasciitis 12/01/2016  . 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 Allergic rhinitis 02/08/2015  . Apnea, sleep 02/08/2015  . 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  Social History   Social History  . Marital status: Single    Spouse name: N/A  . Number of children: N/A  . Years of education: N/A   Occupational History  . Not on file.   Social History Main Topics  . Smoking status: Current Some Day Smoker  . Smokeless tobacco: Never Used  . Alcohol use No  . Drug use: No  . Sexual activity: Not on file   Other Topics Concern  . Not on file   Social History Narrative  . No narrative on file    Outpatient Encounter Prescriptions as of 02/24/2017  Medication Sig  . fluticasone (FLONASE) 50 MCG/ACT nasal spray Place 2 sprays into both nostrils daily.  . montelukast (SINGULAIR) 10 MG tablet Take 1 tablet (10 mg total) by mouth at bedtime.  . [DISCONTINUED] doxycycline (VIBRA-TABS) 100 MG tablet Take 1 tablet (100 mg total) by mouth 2 (two) times daily.   No facility-administered encounter medications on file as of 02/24/2017.     Allergies  Allergen Reactions  . Aspirin     Review of Systems  Constitutional: Negative for fever and malaise/fatigue.  Respiratory: Negative for cough, shortness of breath and wheezing.   Cardiovascular: Negative for chest pain, palpitations and orthopnea.  Genitourinary: Negative for dysuria, flank pain, frequency, hematuria and urgency.  Neurological:  Negative for weakness.    Objective:  BP 122/82 (BP Location: Right Arm, Patient Position: Sitting, Cuff Size: Normal)   Pulse 60   Temp 97.9 F (36.6 C) (Oral)   Resp 16   Wt 193 lb (87.5 kg)   BMI 26.92 kg/m   Physical Exam  Constitutional: He is oriented to person, place, and time and well-developed, well-nourished, and in no distress.  HENT:  Head: Normocephalic and atraumatic.  Eyes: Conjunctivae are normal.  Neck: Neck supple.  Cardiovascular: Normal rate, regular rhythm and normal heart sounds.   Pulmonary/Chest: Effort normal.  Abdominal: Soft.  Neurological: He is alert and oriented to person, place, and time. Gait normal. GCS score is 15.  Skin: Skin is warm and dry.  Psychiatric: Mood, memory, affect and judgment normal.    Assessment and Plan :  Hematuria Microscopic negative today but will refer to urology for appropriate evaluation if needed. Asplenia  I have done the exam and reviewed the chart and it is accurate to the best of my knowledge. Dentist has been used and  any errors in dictation or transcription are unintentional. Julieanne Manson M.D. [facility]

## 2017-02-25 ENCOUNTER — Telehealth: Payer: Self-pay | Admitting: Family Medicine

## 2017-02-25 NOTE — Telephone Encounter (Signed)
Pt's mom called wanting to speak with Michelle Nasutilena about her sons visit yesterday and about his UA.  Her call back Is [phone]  Thanks Barth Kirksteri

## 2017-02-25 NOTE — Telephone Encounter (Signed)
Sarah, The patient's mother called and asked if the referral could be made with Dr Artis FlockWolfe as the patient and family knows him.  Also, Dr Artis FlockWolfe needs to be aware that the patient is asplenia and is immunocompromised.  I wasn't sure how much of the notes would go to him. Thanks Northwest AirlinesElena

## 2017-02-27 LAB — URINE CULTURE

## 2017-03-04 DIAGNOSIS — R3121 Asymptomatic microscopic hematuria: Secondary | ICD-10-CM | POA: Diagnosis not present

## 2017-03-18 DIAGNOSIS — R3121 Asymptomatic microscopic hematuria: Secondary | ICD-10-CM | POA: Diagnosis not present

## 2017-04-01 DIAGNOSIS — R3121 Asymptomatic microscopic hematuria: Secondary | ICD-10-CM | POA: Diagnosis not present

## 2017-04-21 DIAGNOSIS — L400 Psoriasis vulgaris: Secondary | ICD-10-CM | POA: Diagnosis not present

## 2017-05-11 DIAGNOSIS — Z23 Encounter for immunization: Secondary | ICD-10-CM | POA: Diagnosis not present

## 2017-12-21 DIAGNOSIS — D2371 Other benign neoplasm of skin of right lower limb, including hip: Secondary | ICD-10-CM | POA: Diagnosis not present

## 2017-12-21 DIAGNOSIS — L400 Psoriasis vulgaris: Secondary | ICD-10-CM | POA: Diagnosis not present

## 2017-12-21 DIAGNOSIS — L718 Other rosacea: Secondary | ICD-10-CM | POA: Diagnosis not present

## 2018-05-07 DIAGNOSIS — H903 Sensorineural hearing loss, bilateral: Secondary | ICD-10-CM | POA: Diagnosis not present

## 2018-05-07 DIAGNOSIS — Z9621 Cochlear implant status: Secondary | ICD-10-CM | POA: Diagnosis not present

## 2018-09-07 ENCOUNTER — Other Ambulatory Visit: Payer: Self-pay | Admitting: Family Medicine

## 2018-09-07 DIAGNOSIS — J301 Allergic rhinitis due to pollen: Secondary | ICD-10-CM

## 2018-09-16 DIAGNOSIS — J301 Allergic rhinitis due to pollen: Secondary | ICD-10-CM | POA: Diagnosis not present

## 2018-09-22 ENCOUNTER — Other Ambulatory Visit: Payer: Self-pay

## 2018-09-22 MED ORDER — AMOXICILLIN 500 MG PO CAPS: 500.0000 mg | ORAL_CAPSULE | Freq: Two times a day (BID) | ORAL | 0 refills | Status: DC

## 2019-10-05 ENCOUNTER — Other Ambulatory Visit: Payer: Self-pay | Admitting: Family Medicine

## 2019-10-05 DIAGNOSIS — J301 Allergic rhinitis due to pollen: Secondary | ICD-10-CM

## 2019-10-05 NOTE — Telephone Encounter (Signed)
Call to patient- annual exam scheduled.

## 2019-10-26 NOTE — Progress Notes (Deleted)
Complete physical exam   Patient: Jeremiah [name]   DOB: [birth year]   39 y.o. Male  MRN: [id number] Visit Date: 10/26/2019  Today's healthcare provider: Wilhemena Durie, MD   No chief complaint on file.  Subjective    Jeremiah [name] is a 39 y.o. male who presents today for a complete physical exam.  He reports consuming a {diet types:17450} diet. {Exercise:19826} He generally feels {well/fairly well/poorly:18703}. He reports sleeping {well/fairly well/poorly:18703}. He {does/does not:200015} have additional problems to discuss today.  HPI  ***  No past medical history on file. Past Surgical History:  Procedure Laterality Date  . COCHLEAR IMPLANT     2000 and 2007  . SPLENECTOMY  1988  . WISDOM TOOTH EXTRACTION  03/2014   Social History   Socioeconomic History  . Marital status: Single    Spouse name: Not on file  . Number of children: Not on file  . Years of education: Not on file  . Highest education level: Not on file  Occupational History  . Not on file  Tobacco Use  . Smoking status: Current Some Day Smoker  . Smokeless tobacco: Never Used  Substance and Sexual Activity  . Alcohol use: No  . Drug use: No  . Sexual activity: Not on file  Other Topics Concern  . Not on file  Social History Narrative  . Not on file   Social Determinants of Health   Financial Resource Strain:   . Difficulty of Paying Living Expenses:   Food Insecurity:   . Worried About Charity fundraiser in the Last Year:   . Arboriculturist in the Last Year:   Transportation Needs:   . Film/video editor (Medical):   Marland Kitchen Lack of Transportation (Non-Medical):   Physical Activity:   . Days of Exercise per Week:   . Minutes of Exercise per Session:   Stress:   . Feeling of Stress :   Social Connections:   . Frequency of Communication with Friends and Family:   . Frequency of Social Gatherings with Friends and Family:   . Attends Religious Services:   . Active Member of Clubs  or Organizations:   . Attends Archivist Meetings:   Marland Kitchen Marital Status:   Intimate Partner Violence:   . Fear of Current or Ex-Partner:   . Emotionally Abused:   Marland Kitchen Physically Abused:   . Sexually Abused:    Family Status  Relation Name Status  . Mother  Alive  . Father  Alive  . Brother  Alive   Family History  Problem Relation Age of Onset  . Hyperlipidemia Father    Allergies  Allergen Reactions  . Aspirin     Patient Care Team: Jerrol Banana., MD as PCP - General (Family Medicine)   Medications: Outpatient Medications Prior to Visit  Medication Sig  . amoxicillin (AMOXIL) 500 MG capsule Take 1 capsule (500 mg total) by mouth 2 (two) times daily.  . fluticasone (FLONASE) 50 MCG/ACT nasal spray Place 2 sprays into both nostrils daily.  . montelukast (SINGULAIR) 10 MG tablet TAKE 1 TABLET BY MOUTH AT BEDTIME   No facility-administered medications prior to visit.    Review of Systems  {Show previous labs (optional):23779::" "}  Objective    There were no vitals taken for this visit. {Show previous vital signs (optional):23777::" "}  Physical Exam  ***  Depression Screen  PHQ 2/9 Scores 10/16/2016 03/14/2015  PHQ -  2 Score 0 0  PHQ- 9 Score 0 -    No results found for any visits on 10/31/19.  Assessment & Plan    Routine Health Maintenance and Physical Exam  Exercise Activities and Dietary recommendations Goals   None     Immunization History  Administered Date(s) Administered  . DTaP 05/08/1981, 08/13/1981, 10/26/1981, 11/21/1982, 02/12/1997  . Hepatitis A 04/30/2015  . Hepatitis B 04/30/2015  . Hepatitis B, adult 06/05/2015  . HiB (PRP-OMP) 07/14/1984  . HiB (PRP-T) 10/16/2016  . IPV 05/08/1981, 08/13/1981, 10/26/1981, 11/21/1982, 02/12/1997  . Influenza,inj,Quad PF,6+ Mos 05/01/2015  . Influenza-Unspecified 05/31/2018, 05/17/2019  . MMR 08/15/1982  . Meningococcal B, OMV 02/14/2015, 04/30/2015  . Meningococcal Conjugate  07/17/2010, 09/18/2010, 02/14/2015  . Pneumococcal Conjugate-13 02/14/2015  . Pneumococcal Polysaccharide-23 07/03/2005  . Tdap 07/17/2010    Health Maintenance  Topic Date Due  . Meningococcal B Vaccine (1 of 4 - Increased Risk Bexsero 2-dose series) 03/09/1991  . HIV Screening  Never done  . COVID-19 Vaccine (1) Never done  . INFLUENZA VACCINE  01/29/2020  . TETANUS/TDAP  07/17/2020    Discussed health benefits of physical activity, and encouraged him to engage in regular exercise appropriate for his age and condition.  ***  No follow-ups on file.     {provider attestation***:1}   Wilhemena Durie, MD  [facility] [phone] (phone) [phone] (fax)  [place]

## 2019-10-27 ENCOUNTER — Encounter: Payer: Self-pay | Admitting: Family Medicine

## 2019-10-31 ENCOUNTER — Encounter: Payer: Self-pay | Admitting: Family Medicine

## 2020-02-06 ENCOUNTER — Telehealth: Payer: Self-pay

## 2020-02-06 NOTE — Telephone Encounter (Signed)
Copied from CRM [id number]. Topic: General - Inquiry >> Feb 06, 2020  9:49 AM Deborha Payment wrote: Reason for CRM: Mother of patient is requesting Michelle Nasuti to call her back.  Patient mother did not want to disclose information.  Mother is on [place]. Call back [phone]

## 2020-02-06 NOTE — Telephone Encounter (Signed)
LMOVM for Claris Gower to return call.

## 2020-02-14 NOTE — Telephone Encounter (Signed)
Left another vm for pt's mom to cb.

## 2020-02-17 NOTE — Telephone Encounter (Signed)
Patient's mother is not returning calls. Will close message.

## 2020-09-30 ENCOUNTER — Other Ambulatory Visit: Payer: Self-pay

## 2020-09-30 ENCOUNTER — Emergency Department: Payer: BC Managed Care – PPO

## 2020-09-30 ENCOUNTER — Inpatient Hospital Stay
Admission: EM | Admit: 2020-09-30 | Discharge: 2020-10-05 | DRG: 871 | Disposition: A | Discharge status: Home / Self Care | Payer: BC Managed Care – PPO | Attending: Internal Medicine | Admitting: Internal Medicine

## 2020-09-30 DIAGNOSIS — H905 Unspecified sensorineural hearing loss: Secondary | ICD-10-CM | POA: Diagnosis present

## 2020-09-30 DIAGNOSIS — Z79899 Other long term (current) drug therapy: Secondary | ICD-10-CM

## 2020-09-30 DIAGNOSIS — E876 Hypokalemia: Secondary | ICD-10-CM | POA: Diagnosis not present

## 2020-09-30 DIAGNOSIS — J069 Acute upper respiratory infection, unspecified: Secondary | ICD-10-CM | POA: Diagnosis present

## 2020-09-30 DIAGNOSIS — A419 Sepsis, unspecified organism: Secondary | ICD-10-CM | POA: Diagnosis not present

## 2020-09-30 DIAGNOSIS — N179 Acute kidney failure, unspecified: Secondary | ICD-10-CM

## 2020-09-30 DIAGNOSIS — N170 Acute kidney failure with tubular necrosis: Secondary | ICD-10-CM | POA: Diagnosis present

## 2020-09-30 DIAGNOSIS — Z9081 Acquired absence of spleen: Secondary | ICD-10-CM

## 2020-09-30 DIAGNOSIS — R319 Hematuria, unspecified: Secondary | ICD-10-CM

## 2020-09-30 DIAGNOSIS — F17200 Nicotine dependence, unspecified, uncomplicated: Secondary | ICD-10-CM | POA: Diagnosis present

## 2020-09-30 DIAGNOSIS — D509 Iron deficiency anemia, unspecified: Secondary | ICD-10-CM | POA: Diagnosis present

## 2020-09-30 DIAGNOSIS — R8281 Pyuria: Secondary | ICD-10-CM | POA: Diagnosis present

## 2020-09-30 DIAGNOSIS — D62 Acute posthemorrhagic anemia: Secondary | ICD-10-CM | POA: Diagnosis present

## 2020-09-30 DIAGNOSIS — G4733 Obstructive sleep apnea (adult) (pediatric): Secondary | ICD-10-CM | POA: Diagnosis present

## 2020-09-30 DIAGNOSIS — H10021 Other mucopurulent conjunctivitis, right eye: Secondary | ICD-10-CM | POA: Diagnosis present

## 2020-09-30 DIAGNOSIS — R030 Elevated blood-pressure reading, without diagnosis of hypertension: Secondary | ICD-10-CM | POA: Diagnosis present

## 2020-09-30 DIAGNOSIS — R197 Diarrhea, unspecified: Secondary | ICD-10-CM | POA: Diagnosis present

## 2020-09-30 DIAGNOSIS — Z886 Allergy status to analgesic agent status: Secondary | ICD-10-CM

## 2020-09-30 DIAGNOSIS — R6520 Severe sepsis without septic shock: Secondary | ICD-10-CM | POA: Diagnosis present

## 2020-09-30 DIAGNOSIS — R809 Proteinuria, unspecified: Secondary | ICD-10-CM

## 2020-09-30 DIAGNOSIS — Z9621 Cochlear implant status: Secondary | ICD-10-CM

## 2020-09-30 DIAGNOSIS — Z20822 Contact with and (suspected) exposure to covid-19: Secondary | ICD-10-CM | POA: Diagnosis present

## 2020-09-30 DIAGNOSIS — N009 Acute nephritic syndrome with unspecified morphologic changes: Secondary | ICD-10-CM | POA: Diagnosis present

## 2020-09-30 DIAGNOSIS — I10 Essential (primary) hypertension: Secondary | ICD-10-CM | POA: Diagnosis present

## 2020-09-30 LAB — CBC WITH DIFFERENTIAL/PLATELET
Abs Immature Granulocytes: 0.07 10*3/uL (ref 0.00–0.07)
Basophils Absolute: 0.1 10*3/uL (ref 0.0–0.1)
Basophils Relative: 1 %
Eosinophils Absolute: 0.3 10*3/uL (ref 0.0–0.5)
Eosinophils Relative: 2 %
HCT: 33.9 % — ABNORMAL LOW (ref 39.0–52.0)
Hemoglobin: 12 g/dL — ABNORMAL LOW (ref 13.0–17.0)
Immature Granulocytes: 0 %
Lymphocytes Relative: 5 %
Lymphs Abs: 0.8 10*3/uL (ref 0.7–4.0)
MCH: 29.1 pg (ref 26.0–34.0)
MCHC: 35.4 g/dL (ref 30.0–36.0)
MCV: 82.3 fL (ref 80.0–100.0)
Monocytes Absolute: 0.1 10*3/uL (ref 0.1–1.0)
Monocytes Relative: 1 %
Neutro Abs: 15.7 10*3/uL — ABNORMAL HIGH (ref 1.7–7.7)
Neutrophils Relative %: 91 %
Platelets: 302 10*3/uL (ref 150–400)
RBC: 4.12 MIL/uL — ABNORMAL LOW (ref 4.22–5.81)
RDW: 15.5 % (ref 11.5–15.5)
WBC: 17 10*3/uL — ABNORMAL HIGH (ref 4.0–10.5)
nRBC: 0 % (ref 0.0–0.2)

## 2020-09-30 LAB — URINALYSIS, COMPLETE (UACMP) WITH MICROSCOPIC
Bilirubin Urine: NEGATIVE
Glucose, UA: NEGATIVE mg/dL
Ketones, ur: NEGATIVE mg/dL
Nitrite: NEGATIVE
Protein, ur: >=300 mg/dL — AB
RBC / HPF: >50 RBC/hpf — ABNORMAL HIGH (ref 0–5)
Specific Gravity, Urine: 1.014 (ref 1.005–1.030)
Squamous Epithelial / HPF: NONE SEEN (ref 0–5)
pH: 5 (ref 5.0–8.0)

## 2020-09-30 LAB — COMPREHENSIVE METABOLIC PANEL
ALT: 13 U/L (ref 0–44)
AST: 19 U/L (ref 15–41)
Albumin: 3.3 g/dL — ABNORMAL LOW (ref 3.5–5.0)
Alkaline Phosphatase: 99 U/L (ref 38–126)
Anion gap: 7 (ref 5–15)
BUN: 28 mg/dL — ABNORMAL HIGH (ref 6–20)
CO2: 23 mmol/L (ref 22–32)
Calcium: 8.2 mg/dL — ABNORMAL LOW (ref 8.9–10.3)
Chloride: 106 mmol/L (ref 98–111)
Creatinine, Ser: 1.89 mg/dL — ABNORMAL HIGH (ref 0.61–1.24)
GFR, Estimated: 46 mL/min — ABNORMAL LOW (ref >60)
Glucose, Bld: 99 mg/dL (ref 70–99)
Potassium: 3.7 mmol/L (ref 3.5–5.1)
Sodium: 136 mmol/L (ref 135–145)
Total Bilirubin: 0.9 mg/dL (ref 0.3–1.2)
Total Protein: 7.1 g/dL (ref 6.5–8.1)

## 2020-09-30 LAB — LACTIC ACID, PLASMA: Lactic Acid, Venous: 0.9 mmol/L (ref 0.5–1.9)

## 2020-09-30 NOTE — ED Triage Notes (Signed)
Pt states he has had fevers and chills and congestion and HA since 6 pm today, Pt states he was given given steroids for his eye redness. Pt uses cochlear implant. Pt states fever was up to 103 at home.

## 2020-10-01 ENCOUNTER — Emergency Department: Payer: BC Managed Care – PPO

## 2020-10-01 DIAGNOSIS — Z9621 Cochlear implant status: Secondary | ICD-10-CM

## 2020-10-01 DIAGNOSIS — R6520 Severe sepsis without septic shock: Secondary | ICD-10-CM

## 2020-10-01 DIAGNOSIS — F17210 Nicotine dependence, cigarettes, uncomplicated: Secondary | ICD-10-CM | POA: Diagnosis not present

## 2020-10-01 DIAGNOSIS — D509 Iron deficiency anemia, unspecified: Secondary | ICD-10-CM | POA: Diagnosis present

## 2020-10-01 DIAGNOSIS — Z886 Allergy status to analgesic agent status: Secondary | ICD-10-CM | POA: Diagnosis not present

## 2020-10-01 DIAGNOSIS — A419 Sepsis, unspecified organism: Principal | ICD-10-CM | POA: Diagnosis present

## 2020-10-01 DIAGNOSIS — Z20822 Contact with and (suspected) exposure to covid-19: Secondary | ICD-10-CM | POA: Diagnosis present

## 2020-10-01 DIAGNOSIS — R030 Elevated blood-pressure reading, without diagnosis of hypertension: Secondary | ICD-10-CM | POA: Diagnosis present

## 2020-10-01 DIAGNOSIS — J069 Acute upper respiratory infection, unspecified: Secondary | ICD-10-CM | POA: Diagnosis present

## 2020-10-01 DIAGNOSIS — F17200 Nicotine dependence, unspecified, uncomplicated: Secondary | ICD-10-CM | POA: Diagnosis present

## 2020-10-01 DIAGNOSIS — I10 Essential (primary) hypertension: Secondary | ICD-10-CM | POA: Diagnosis present

## 2020-10-01 DIAGNOSIS — E876 Hypokalemia: Secondary | ICD-10-CM | POA: Diagnosis not present

## 2020-10-01 DIAGNOSIS — N179 Acute kidney failure, unspecified: Secondary | ICD-10-CM | POA: Diagnosis not present

## 2020-10-01 DIAGNOSIS — N170 Acute kidney failure with tubular necrosis: Secondary | ICD-10-CM | POA: Diagnosis present

## 2020-10-01 DIAGNOSIS — N159 Renal tubulo-interstitial disease, unspecified: Secondary | ICD-10-CM | POA: Diagnosis not present

## 2020-10-01 DIAGNOSIS — D62 Acute posthemorrhagic anemia: Secondary | ICD-10-CM | POA: Diagnosis present

## 2020-10-01 DIAGNOSIS — R197 Diarrhea, unspecified: Secondary | ICD-10-CM | POA: Diagnosis present

## 2020-10-01 DIAGNOSIS — Z79899 Other long term (current) drug therapy: Secondary | ICD-10-CM | POA: Diagnosis not present

## 2020-10-01 DIAGNOSIS — N009 Acute nephritic syndrome with unspecified morphologic changes: Secondary | ICD-10-CM | POA: Diagnosis present

## 2020-10-01 DIAGNOSIS — Z9081 Acquired absence of spleen: Secondary | ICD-10-CM | POA: Diagnosis not present

## 2020-10-01 DIAGNOSIS — Z7952 Long term (current) use of systemic steroids: Secondary | ICD-10-CM | POA: Diagnosis not present

## 2020-10-01 DIAGNOSIS — H10021 Other mucopurulent conjunctivitis, right eye: Secondary | ICD-10-CM | POA: Diagnosis present

## 2020-10-01 DIAGNOSIS — R7881 Bacteremia: Secondary | ICD-10-CM | POA: Diagnosis not present

## 2020-10-01 DIAGNOSIS — G4733 Obstructive sleep apnea (adult) (pediatric): Secondary | ICD-10-CM | POA: Diagnosis present

## 2020-10-01 DIAGNOSIS — R809 Proteinuria, unspecified: Secondary | ICD-10-CM | POA: Diagnosis present

## 2020-10-01 DIAGNOSIS — H905 Unspecified sensorineural hearing loss: Secondary | ICD-10-CM | POA: Diagnosis present

## 2020-10-01 DIAGNOSIS — Z5181 Encounter for therapeutic drug level monitoring: Secondary | ICD-10-CM | POA: Diagnosis present

## 2020-10-01 DIAGNOSIS — R8281 Pyuria: Secondary | ICD-10-CM | POA: Diagnosis present

## 2020-10-01 LAB — C DIFFICILE QUICK SCREEN W PCR REFLEX
C Diff antigen: NEGATIVE
C Diff interpretation: NOT DETECTED
C Diff toxin: NEGATIVE

## 2020-10-01 LAB — BASIC METABOLIC PANEL
Anion gap: 10 (ref 5–15)
BUN: 31 mg/dL — ABNORMAL HIGH (ref 6–20)
CO2: 22 mmol/L (ref 22–32)
Calcium: 7.5 mg/dL — ABNORMAL LOW (ref 8.9–10.3)
Chloride: 106 mmol/L (ref 98–111)
Creatinine, Ser: 2.43 mg/dL — ABNORMAL HIGH (ref 0.61–1.24)
GFR, Estimated: 34 mL/min — ABNORMAL LOW (ref >60)
Glucose, Bld: 134 mg/dL — ABNORMAL HIGH (ref 70–99)
Potassium: 4.4 mmol/L (ref 3.5–5.1)
Sodium: 138 mmol/L (ref 135–145)

## 2020-10-01 LAB — GASTROINTESTINAL PANEL BY PCR, STOOL (REPLACES STOOL CULTURE)

## 2020-10-01 LAB — CBC
HCT: 31.5 % — ABNORMAL LOW (ref 39.0–52.0)
Hemoglobin: 11 g/dL — ABNORMAL LOW (ref 13.0–17.0)
MCH: 28.9 pg (ref 26.0–34.0)
MCHC: 34.9 g/dL (ref 30.0–36.0)
MCV: 82.7 fL (ref 80.0–100.0)
Platelets: 224 10*3/uL (ref 150–400)
RBC: 3.81 MIL/uL — ABNORMAL LOW (ref 4.22–5.81)
RDW: 15.8 % — ABNORMAL HIGH (ref 11.5–15.5)
WBC: 31.8 10*3/uL — ABNORMAL HIGH (ref 4.0–10.5)
nRBC: 0 % (ref 0.0–0.2)

## 2020-10-01 LAB — CREATININE, SERUM
Creatinine, Ser: 2.07 mg/dL — ABNORMAL HIGH (ref 0.61–1.24)
GFR, Estimated: 41 mL/min — ABNORMAL LOW (ref >60)

## 2020-10-01 LAB — LACTIC ACID, PLASMA: Lactic Acid, Venous: 1.2 mmol/L (ref 0.5–1.9)

## 2020-10-01 LAB — SODIUM, URINE, RANDOM: Sodium, Ur: 38 mmol/L

## 2020-10-01 LAB — RESP PANEL BY RT-PCR (FLU A&B, COVID) ARPGX2
Influenza A by PCR: NEGATIVE
Influenza B by PCR: NEGATIVE
SARS Coronavirus 2 by RT PCR: NEGATIVE

## 2020-10-01 LAB — PROCALCITONIN: Procalcitonin: 0.43 ng/mL

## 2020-10-01 LAB — HIV ANTIBODY (ROUTINE TESTING W REFLEX): HIV Screen 4th Generation wRfx: NONREACTIVE

## 2020-10-01 LAB — OSMOLALITY, URINE: Osmolality, Ur: 347 mOsm/kg (ref 300–900)

## 2020-10-01 LAB — CREATININE, URINE, RANDOM: Creatinine, Urine: 124 mg/dL

## 2020-10-01 MED ORDER — SODIUM CHLORIDE 0.9 % IV SOLN: 1.0000 g | INTRAVENOUS | Status: DC

## 2020-10-01 MED ORDER — SODIUM CHLORIDE 0.9 % IV SOLN
1.0000 g | INTRAVENOUS | Status: DC
  Administered 2020-10-01 – 2020-10-05 (×5): 1 g via INTRAVENOUS
  Filled 2020-10-01 (×2): qty 10
  Filled 2020-10-01: qty 1
  Filled 2020-10-01 (×2): qty 10
  Filled 2020-10-01: qty 1

## 2020-10-01 MED ORDER — ACETAMINOPHEN 325 MG PO TABS
650.0000 mg | ORAL_TABLET | Freq: Four times a day (QID) | ORAL | Status: DC | PRN
  Administered 2020-10-03 – 2020-10-05 (×7): 650 mg via ORAL
  Filled 2020-10-01 (×7): qty 2

## 2020-10-01 MED ORDER — ONDANSETRON HCL 4 MG PO TABS: 4.0000 mg | ORAL_TABLET | Freq: Four times a day (QID) | ORAL | Status: DC | PRN

## 2020-10-01 MED ORDER — ACETAMINOPHEN 650 MG RE SUPP: 650.0000 mg | Freq: Four times a day (QID) | RECTAL | Status: DC | PRN

## 2020-10-01 MED ORDER — ACETAMINOPHEN 500 MG PO TABS
1000.0000 mg | ORAL_TABLET | Freq: Once | ORAL | Status: AC
  Administered 2020-10-01: 1000 mg via ORAL
  Filled 2020-10-01: qty 2

## 2020-10-01 MED ORDER — VANCOMYCIN HCL 1250 MG/250ML IV SOLN
1250.0000 mg | INTRAVENOUS | Status: DC
  Administered 2020-10-02 – 2020-10-03 (×2): 1250 mg via INTRAVENOUS
  Filled 2020-10-01 (×2): qty 250

## 2020-10-01 MED ORDER — LACTATED RINGERS IV BOLUS (SEPSIS)
1000.0000 mL | Freq: Once | INTRAVENOUS | Status: AC
  Administered 2020-10-01: 1000 mL via INTRAVENOUS

## 2020-10-01 MED ORDER — VANCOMYCIN HCL 2000 MG/400ML IV SOLN
2000.0000 mg | Freq: Once | INTRAVENOUS | Status: AC
  Administered 2020-10-01: 2000 mg via INTRAVENOUS
  Filled 2020-10-01: qty 400

## 2020-10-01 MED ORDER — TOBRAMYCIN-DEXAMETHASONE 0.3-0.1 % OP SUSP
2.0000 [drp] | Freq: Two times a day (BID) | OPHTHALMIC | Status: AC
  Administered 2020-10-01 – 2020-10-02 (×4): 2 [drp] via OPHTHALMIC
  Filled 2020-10-01: qty 2.5

## 2020-10-01 MED ORDER — ONDANSETRON HCL 4 MG/2ML IJ SOLN: 4.0000 mg | Freq: Four times a day (QID) | INTRAMUSCULAR | Status: DC | PRN

## 2020-10-01 MED ORDER — SODIUM CHLORIDE 0.9 % IV SOLN: INTRAVENOUS | Status: DC

## 2020-10-01 MED ORDER — VANCOMYCIN HCL 1500 MG/300ML IV SOLN: 1500.0000 mg | INTRAVENOUS | Status: DC

## 2020-10-01 MED ORDER — ENOXAPARIN SODIUM 40 MG/0.4ML ~~LOC~~ SOLN
40.0000 mg | SUBCUTANEOUS | Status: DC
  Administered 2020-10-01 – 2020-10-03 (×3): 40 mg via SUBCUTANEOUS
  Filled 2020-10-01 (×3): qty 0.4

## 2020-10-01 NOTE — Progress Notes (Signed)
Elink following for Sepsis Protocol 

## 2020-10-01 NOTE — Progress Notes (Signed)
CODE SEPSIS - PHARMACY COMMUNICATION  **Broad Spectrum Antibiotics should be administered within 1 hour of Sepsis diagnosis**  Time Code Sepsis Called/Page Received:  4/4 @ 0022   Antibiotics Ordered: Ceftriaxone   Time of 1st antibiotic administration:  4/4 @ 0058   Additional action taken by pharmacy:   If necessary, Name of Provider/Nurse Contacted:     Mckinzey Entwistle D ,PharmD Clinical Pharmacist  10/01/2020  1:04 AM

## 2020-10-01 NOTE — Progress Notes (Signed)
PHARMACY -  BRIEF ANTIBIOTIC NOTE   Pharmacy has received consult(s) for Vancomycin from an ED provider.  The patient's profile has been reviewed for ht/wt/allergies/indication/available labs.    One time order(s) placed for  Vancomycin 2 gm IV  1.   Further antibiotics/pharmacy consults should be ordered by admitting physician if indicated.                       Thank you, Sulema Braid D 10/01/2020  2:33 AM

## 2020-10-01 NOTE — H&P (Signed)
History and Physical    SIMS LADAY YCX:[id number] DOB: 07-25-1980 DOA: 09/30/2020  PCP: Maple Hudson., MD   Patient coming from: Home  I have personally briefly reviewed patient's old medical records in [facility]  Chief Complaint: Fever, malaise, sinus congestion  HPI: Jeremiah [name] is a 40 y.o. 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  Patient has a several day history of sinus congestion and sinus pressure with her right pinkeye treated by his ENT with tobramycin/dexamethasone eyedrops.  He denied cough or shortness of breath or chest pain.  Has no nausea vomiting no abdominal pain or dysuria.  States he had a single loose watery movement at around lunchtime on 09/30/2020.  Denies headache visual disturbance or neck pain.  He endorses generalized malaise, fatigue and decreased appetite. ED course: On arrival, BP 171/110, pulse 115 temperature 100.8, respirations 20 with O2 sat 96% on room air.  Blood work significant for WBC 17,000, lactic acid 1.2 and procalcitonin 0.43.  Creatinine 1.89. Imaging chest x-ray: No acute cardiopulmonary disease CT abdomen pelvis renal stone study with no acute process.  Patient started on sepsis fluid bolus and IV Rocephin and vancomycin.  Hospitalist consulted for admission.  Review of Systems: As per HPI otherwise all other systems on review of systems negative.    History reviewed. No pertinent past medical history.  Past Surgical History:  Procedure Laterality Date  . COCHLEAR IMPLANT     2000 and 2007  . SPLENECTOMY  1988  . WISDOM TOOTH EXTRACTION  03/2014     reports that he has been smoking. He has never used smokeless tobacco. He reports that he does not drink alcohol and does not use drugs.  Allergies  Allergen Reactions  . Aspirin     Family History  Problem Relation Age  of Onset  . Hyperlipidemia Father       Prior to Admission medications   Medication Sig Start Date End Date Taking? Authorizing Provider  amoxicillin (AMOXIL) 500 MG capsule Take 1 capsule (500 mg total) by mouth 2 (two) times daily. Patient not taking: Reported on 10/01/2020 09/22/18   Maple Hudson., MD  fluticasone [facility]) 50 MCG/ACT nasal spray Place 2 sprays into both nostrils daily. Patient not taking: Reported on 10/01/2020 10/16/16   Maple Hudson., MD  montelukast (SINGULAIR) 10 MG tablet TAKE 1 TABLET BY MOUTH AT BEDTIME Patient not taking: Reported on 10/01/2020 10/05/19   Maple Hudson., MD  tobramycin-dexamethasone [facility]) ophthalmic solution Place 2 drops into both eyes 2 (two) times daily. For 4 days 09/29/20   [provider]    Physical Exam: Vitals:   10/01/20 0115 10/01/20 0130 10/01/20 0200 10/01/20 0230  BP:  (!) 149/98 (!) 142/94 (!) 139/96  Pulse: (!) 108 (!) 104 (!) 106 (!) 107  Resp:    16  Temp:      TempSrc:      SpO2: 97% 97% 95% 96%  Weight:      Height:         Vitals:   10/01/20 0115 10/01/20 0130 10/01/20 0200 10/01/20 0230  BP:  (!) 149/98 (!) 142/94 (!) 139/96  Pulse: (!) 108 (!) 104 (!) 106 (!) 107  Resp:    16  Temp:      TempSrc:      SpO2: 97% 97%  95% 96%  Weight:      Height:          Constitutional: Alert and oriented x 3 . Not in any apparent distress HEENT:      Head: Normocephalic and atraumatic.         Eyes: PERLA, EOMI, Conjunctivae are normal. Sclera is non-icteric.       Mouth/Throat: Mucous membranes are moist.       Neck: Supple with no signs of meningismus. Cardiovascular: Regular rate and rhythm. No murmurs, gallops, or rubs. 2+ symmetrical distal pulses are present . No JVD. No LE edema Respiratory: Respiratory effort normal .Lungs sounds clear bilaterally. No wheezes, crackles, or rhonchi.  Gastrointestinal: Soft, non tender, and non distended with positive bowel sounds.   Genitourinary: No CVA tenderness. Musculoskeletal: Nontender with normal range of motion in all extremities. No cyanosis, or erythema of extremities. Neurologic:  Face is symmetric. Moving all extremities. No gross focal neurologic deficits . Skin: Skin is warm, dry.  No rash or ulcers Psychiatric: Mood and affect are normal    Labs on Admission: I have personally reviewed following labs and imaging studies  CBC: Recent Labs  Lab 09/30/20 2127  WBC 17.0*  NEUTROABS 15.7*  HGB 12.0*  HCT 33.9*  MCV 82.3  PLT 302   Basic Metabolic Panel: Recent Labs  Lab 09/30/20 2127  NA 136  K 3.7  CL 106  CO2 23  GLUCOSE 99  BUN 28*  CREATININE 1.89*  CALCIUM 8.2*   GFR: Estimated Creatinine Clearance: 55.9 mL/min (A) (by C-G formula based on SCr of 1.89 mg/dL (H)). Liver Function Tests: Recent Labs  Lab 09/30/20 2127  AST 19  ALT 13  ALKPHOS 99  BILITOT 0.9  PROT 7.1  ALBUMIN 3.3*   No results for input(s): LIPASE, AMYLASE in the last 168 hours. No results for input(s): AMMONIA in the last 168 hours. Coagulation Profile: No results for input(s): INR, PROTIME in the last 168 hours. Cardiac Enzymes: No results for input(s): CKTOTAL, CKMB, CKMBINDEX, TROPONINI in the last 168 hours. BNP (last 3 results) No results for input(s): PROBNP in the last 8760 hours. HbA1C: No results for input(s): HGBA1C in the last 72 hours. CBG: No results for input(s): GLUCAP in the last 168 hours. Lipid Profile: No results for input(s): CHOL, HDL, LDLCALC, TRIG, CHOLHDL, LDLDIRECT in the last 72 hours. Thyroid Function Tests: No results for input(s): TSH, T4TOTAL, FREET4, T3FREE, THYROIDAB in the last 72 hours. Anemia Panel: No results for input(s): VITAMINB12, FOLATE, FERRITIN, TIBC, IRON, RETICCTPCT in the last 72 hours. Urine analysis:    Component Value Date/Time   COLORURINE YELLOW (A) 09/30/2020 2127   APPEARANCEUR CLOUDY (A) 09/30/2020 2127   LABSPEC 1.014 09/30/2020 2127    PHURINE 5.0 09/30/2020 2127   GLUCOSEU NEGATIVE 09/30/2020 2127   HGBUR LARGE (A) 09/30/2020 2127   BILIRUBINUR NEGATIVE 09/30/2020 2127   BILIRUBINUR neg 02/24/2017 1657   KETONESUR NEGATIVE 09/30/2020 2127   PROTEINUR >=300 (A) 09/30/2020 2127   UROBILINOGEN 0.2 02/24/2017 1657   NITRITE NEGATIVE 09/30/2020 2127   LEUKOCYTESUR SMALL (A) 09/30/2020 2127    Radiological Exams on Admission: DG Chest 2 View  Result Date: 09/30/2020 CLINICAL DATA:  Congestion EXAM: CHEST - 2 VIEW COMPARISON:  Nov 20, 2016 FINDINGS: The heart size and mediastinal contours are within normal limits. Both lungs are clear. The visualized skeletal structures are unremarkable. IMPRESSION: No active cardiopulmonary disease. Electronically Signed   By: Katherine Mantle M.D.   On:  09/30/2020 22:12   CT Renal Stone Study  Result Date: 10/01/2020 CLINICAL DATA:  Hematuria EXAM: CT ABDOMEN AND PELVIS WITHOUT CONTRAST TECHNIQUE: Multidetector CT imaging of the abdomen and pelvis was performed following the standard protocol without IV contrast. COMPARISON:  CT pelvis 08/07/2014 FINDINGS: Lower chest: The lung bases are clear. Hepatobiliary: No focal liver abnormality is seen. No gallstones, gallbladder wall thickening, or biliary dilatation. Pancreas: Unremarkable. No pancreatic ductal dilatation or surrounding inflammatory changes. 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 Adrenals/Urinary Tract: Adrenal glands are unremarkable. Kidneys are normal, without renal calculi, focal lesion, or hydronephrosis. Bladder is unremarkable. Stomach/Bowel: Stomach is within normal limits. Appendix appears normal. No evidence of bowel wall thickening, distention, or inflammatory changes. Vascular/Lymphatic: No significant vascular findings are present. No enlarged abdominal or pelvic lymph nodes. Reproductive: Uterus and bilateral adnexa are unremarkable. Other: No abdominal wall hernia or abnormality. No abdominopelvic  ascites. Musculoskeletal: No acute or significant osseous findings. IMPRESSION: No acute process demonstrated in the abdomen or pelvis. No renal or ureteral stone or obstruction. Absence of the spleen. Electronically Signed   By: Burman Nieves M.D.   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  Lactic acid normal and procalcitonin 0.43 -We will continue Rocephin and vancomycin started in the emergency room  Diarrhea x1 -Patient had a single watery BM 16 hours prior to admission with no recurrence -Continue to monitor and test stool if further episodes of diarrhea -CT abdomen showed no signs of enteritis so this is unlikely source of suspected sepsis    AKI (acute kidney injury) (HCC) -Creatinine 1.89 but baseline unknown -Likely related to sepsis -Should improve with IV fluids -Monitor renal function, avoid nephrotoxins    Elevated BP without diagnosis of hypertension -BP 171/110 in the ER -Continue to monitor and start antihypertensives if persistently elevated    History of cochlear implant -No acute suspected.    DVT prophylaxis: Lovenox  Code Status: full code  Family Communication: Mother at bedside Disposition Plan: Back to previous home environment Consults called: none  Status:At the time of admission, it appears that the appropriate admission status  for this patient is INPATIENT. This is judged to be reasonable and necessary in order to provide the required intensity of service to ensure the patient's safety given the presenting symptoms, physical exam findings, and initial radiographic and laboratory data in the context of their  Comorbid conditions.   Patient requires inpatient status due to high intensity of service, high risk for further deterioration and high frequency of surveillance required.   I certify that at the point of admission it is my clinical judgment that the patient will require inpatient hospital care spanning beyond 2 midnights     Andris Baumann MD [facility]     10/01/2020, 3:04 AM

## 2020-10-01 NOTE — Progress Notes (Signed)
Pharmacy Antibiotic Note  Jeremiah [name] is a 40 y.o. male admitted on 09/30/2020 with sepsis.  Pharmacy has been consulted for Vancomycin dosing.  Plan: Vancomycin 2 gm IV X 1 given in ED on 4/4 @ 0248.  Slight bump in creatinine. Will adjust vancomycin dose to 1250 mg IV q24h Goal AUC 400-550 Expected AUC: 424 SCr used: 2.07   Height: 5\' 11"  (180.3 cm) Weight: 87.5 kg (193 lb) IBW/kg (Calculated) : 75.3  Temp (24hrs), Avg:99.2 F (37.3 C), Min:98.3 F (36.8 C), Max:100.8 F (38.2 C)  Recent Labs  Lab 09/30/20 2127 10/01/20 0023 10/01/20 0507  WBC 17.0*  --  31.8*  CREATININE 1.89*  --  2.07*  LATICACIDVEN 0.9 1.2  --     Estimated Creatinine Clearance: 51 mL/min (A) (by C-G formula based on SCr of 2.07 mg/dL (H)).    Allergies  Allergen Reactions  . Aspirin     Antimicrobials this admission: Vancomycin 4/4 >> Ceftriaxone 4/4 >>   Microbiology results: 4/4 BCx: NG 4/3 UCx: pending    Thank you for allowing pharmacy to be a part of this patient's care.  6/3, PharmD 10/01/2020 12:11 PM

## 2020-10-01 NOTE — ED Provider Notes (Signed)
[facility] Emergency Department Provider Note  ____________________________________________   Event Date/Time   First MD Initiated Contact with Patient 09/30/20 2346     (approximate)  I have reviewed the triage vital signs and the nursing notes.   HISTORY  Chief Complaint Fever    HPI Jeremiah [name] is a 40 y.o. 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  He presents tonight for evaluation of fever.  He has had some upper respiratory symptoms for the last several days including increased nasal congestion, sinus pressure, and some right-sided eye redness.  He was seen in clinic by Dr. Andee Poles with ENT about 3 days ago and started on tobramycin/dexamethasone eyedrops and he was thought to have a viral infection.  The patient is not vaccinated against COVID-19.  By today he developed a fever as high as 103 at home which was very concerning to him and his mother given his history of splenectomy.  The patient denies any other specific symptoms.  He has no sore throat, chest pain, shortness of breath, cough, nausea, vomiting, abdominal pain, dysuria, and increased urinary frequency.  He said he has not been eating or drinking as well as usual due to lack of appetite.  Nothing in particular makes his symptoms better or worse and his fever became severe this afternoon.  Other than the fever, he just feels general malaise and fatigue.         History reviewed. No pertinent past medical history.  Patient Active Problem List   Diagnosis Date Noted  . Plantar fasciitis 12/01/2016  . 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 Allergic rhinitis 02/08/2015  . Apnea, sleep 02/08/2015  . 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 COCHLEAR IMPLANT     2000 and 2007  . SPLENECTOMY  1988  . WISDOM TOOTH EXTRACTION  03/2014    Prior to Admission  medications   Medication Sig Start Date End Date Taking? Authorizing Provider  amoxicillin (AMOXIL) 500 MG capsule Take 1 capsule (500 mg total) by mouth 2 (two) times daily. 09/22/18   Maple Hudson., MD  fluticasone [facility]) 50 MCG/ACT nasal spray Place 2 sprays into both nostrils daily. 10/16/16   Maple Hudson., MD  montelukast (SINGULAIR) 10 MG tablet TAKE 1 TABLET BY MOUTH AT BEDTIME 10/05/19   Maple Hudson., MD    Allergies Aspirin  Family History  Problem Relation Age of Onset  . Hyperlipidemia Father     Social History Social History   Tobacco Use  . Smoking status: Current Some Day Smoker  . Smokeless tobacco: Never Used  Substance Use Topics  . Alcohol use: No  . Drug use: No    Review of Systems Constitutional: +fever/chills, positive for general malaise. Eyes: No visual changes. ENT: No sore throat.  Sinus congestion, runny nose. Cardiovascular: Denies chest pain. Respiratory: Denies shortness of breath. Gastrointestinal: No abdominal pain.  No nausea, no vomiting.  No diarrhea.  No constipation. Genitourinary: Negative for dysuria.  Negative for hematuria. Musculoskeletal: Negative for neck pain.  Negative for back pain. Integumentary: Negative for rash. Neurological: Negative for headaches, focal weakness or numbness.   ____________________________________________   PHYSICAL EXAM:  VITAL SIGNS: ED Triage Vitals  Enc Vitals Group     BP 09/30/20 2117 (!) 171/110     Pulse Rate 09/30/20 2115 (!) 115  Resp 09/30/20 2115 20     Temp 09/30/20 2115 (!) 100.8 F (38.2 C)     Temp Source 09/30/20 2115 Oral     SpO2 09/30/20 2115 96 %     Weight 09/30/20 2116 87.5 kg (193 lb)     Height 09/30/20 2116 1.803 m (5\' 11" )     Head Circumference --      Peak Flow --      Pain Score 09/30/20 2116 3     Pain Loc --      Pain Edu? --      Excl. in GC? --     Constitutional: Alert and oriented.  Eyes: Conjunctivae are normal.   Head: Atraumatic. Nose: No congestion/rhinnorhea. Mouth/Throat: Patient is wearing a mask. Neck: No stridor.  No meningeal signs.   Cardiovascular: Tachycardia, regular rhythm. Good peripheral circulation. Respiratory: Normal respiratory effort.  No retractions. Gastrointestinal: Soft and nontender. No distention.  Musculoskeletal: No lower extremity tenderness nor edema. No gross deformities of extremities. Neurologic:  Normal speech and language. No gross focal neurologic deficits are appreciated.  Skin:  Skin is warm, dry and intact. Psychiatric: Mood and affect are normal. Speech and behavior are normal.  ____________________________________________   LABS (all labs ordered are listed, but only abnormal results are displayed)  Labs Reviewed  COMPREHENSIVE METABOLIC PANEL - Abnormal; Notable for the following components:      Result Value   BUN 28 (*)    Creatinine, Ser 1.89 (*)    Calcium 8.2 (*)    Albumin 3.3 (*)    GFR, Estimated 46 (*)    All other components within normal limits  CBC WITH DIFFERENTIAL/PLATELET - Abnormal; Notable for the following components:   WBC 17.0 (*)    RBC 4.12 (*)    Hemoglobin 12.0 (*)    HCT 33.9 (*)    Neutro Abs 15.7 (*)    All other components within normal limits  URINALYSIS, COMPLETE (UACMP) WITH MICROSCOPIC - Abnormal; Notable for the following components:   Color, Urine YELLOW (*)    APPearance CLOUDY (*)    Hgb urine dipstick LARGE (*)    Protein, ur >=300 (*)    Leukocytes,Ua SMALL (*)    RBC / HPF >50 (*)    Bacteria, UA RARE (*)    All other components within normal limits  RESP PANEL BY RT-PCR (FLU A&B, COVID) ARPGX2  URINE CULTURE  CULTURE, BLOOD (ROUTINE X 2)  CULTURE, BLOOD (ROUTINE X 2)  LACTIC ACID, PLASMA  LACTIC ACID, PLASMA  PROCALCITONIN   ____________________________________________  EKG  None - EKG not ordered by ED physician ____________________________________________  RADIOLOGY Marylou MccoyI, Essence Merle,  personally viewed and evaluated these images (plain radiographs) as part of my medical decision making, as well as reviewing the written report by the radiologist.  ED MD interpretation: Chest x-ray shows no acute abnormalities.  CT renal stone protocol demonstrates no acute process in the abdomen or pelvis and redemonstrates his absent spleen.  No obstructive uropathy.  Official radiology report(s): DG Chest 2 View  Result Date: 09/30/2020 CLINICAL DATA:  Congestion EXAM: CHEST - 2 VIEW COMPARISON:  Nov 20, 2016 FINDINGS: The heart size and mediastinal contours are within normal limits. Both lungs are clear. The visualized skeletal structures are unremarkable. IMPRESSION: No active cardiopulmonary disease. Electronically Signed   By: Katherine Mantlehristopher  Green M.D.   On: 09/30/2020 22:12   CT Renal Stone Study  Result Date: 10/01/2020 CLINICAL DATA:  Hematuria EXAM: CT ABDOMEN AND  PELVIS WITHOUT CONTRAST TECHNIQUE: Multidetector CT imaging of the abdomen and pelvis was performed following the standard protocol without IV contrast. COMPARISON:  CT pelvis 08/07/2014 FINDINGS: Lower chest: The lung bases are clear. Hepatobiliary: No focal liver abnormality is seen. No gallstones, gallbladder wall thickening, or biliary dilatation. Pancreas: Unremarkable. No pancreatic ductal dilatation or surrounding inflammatory changes. 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 Adrenals/Urinary Tract: Adrenal glands are unremarkable. Kidneys are normal, without renal calculi, focal lesion, or hydronephrosis. Bladder is unremarkable. Stomach/Bowel: Stomach is within normal limits. Appendix appears normal. No evidence of bowel wall thickening, distention, or inflammatory changes. Vascular/Lymphatic: No significant vascular findings are present. No enlarged abdominal or pelvic lymph nodes. Reproductive: Uterus and bilateral adnexa are unremarkable. Other: No abdominal wall hernia or abnormality. No  abdominopelvic ascites. Musculoskeletal: No acute or significant osseous findings. IMPRESSION: No acute process demonstrated in the abdomen or pelvis. No renal or ureteral stone or obstruction. Absence of the spleen. Electronically Signed   By: Burman Nieves M.D.   On: 10/01/2020 00:59    ____________________________________________   PROCEDURES   Procedure(s) performed (including Critical Care):  .Critical Care Performed by: Loleta Rose, MD Authorized by: Loleta Rose, MD   Critical care provider statement:    Critical care time (minutes):  30   Critical care time was exclusive of:  Separately billable procedures and treating other patients   Critical care was necessary to treat or prevent imminent or life-threatening deterioration of the following conditions:  Sepsis   Critical care was time spent personally by me on the following activities:  Development of treatment plan with patient or surrogate, discussions with consultants, evaluation of patient's response to treatment, examination of patient, obtaining history from patient or surrogate, ordering and performing treatments and interventions, ordering and review of laboratory studies, ordering and review of radiographic studies, pulse oximetry, re-evaluation of patient's condition and review of old charts     ____________________________________________   INITIAL IMPRESSION / MDM / ASSESSMENT AND PLAN / ED COURSE  As part of my medical decision making, I reviewed the following data within the electronic MEDICAL RECORD NUMBER History obtained from family, Nursing notes reviewed and incorporated, Labs reviewed , Old chart reviewed, Radiograph reviewed , Discussed with admitting physician  and Notes from prior ED visits   Differential diagnosis includes, but is not limited to, UTI/pyonephritis, obstructive uropathy, bacteremia, pneumonia, COVID-19, intra-abdominal infection.  The patient is on the cardiac monitor to evaluate for  evidence of arrhythmia and/or significant heart rate changes.  The patient reportedly had a fever as high as 103 at home and he is still febrile here in the emergency department.  He is also tachycardic and he has a leukocytosis of 17.  This means that he meets sepsis criteria.  Fortunately his lactic acid is normal but also of Concern is his elevated creatinine of 1.9 which technically qualifies as acute organ dysfunction in the setting of sepsis and therefore qualifies as severe sepsis.  He is not hypotensive and does not meet criteria for septic shock.  I ordered sepsis protocol including 30 mL/kg of lactated Ringer's.  Given that the only abnormality other than some sinus/upper respiratory symptoms is the hemoglobinuria, I will obtain a CT renal stone protocol to look for evidence of obstructive uropathy and treat with ceftriaxone 1 g IV.  He may need additional broad-spectrum empiric antibiotics.  Respiratory viral panel is pending.      Clinical Course as of 10/01/20 0305  Mon Oct 01, 2020  0109 Of note, the patient's mother reports that he was told by Dr. Sullivan Lone in the past that he had some blood in his urine and he went to Dr. Evelene Croon with urology but they were not able to identify a specific reason for the hematuria. [CF]  0209 SARS Coronavirus 2 by RT PCR: NEGATIVE [CF]  0217 Workup generally reassuring.  CT scan demonstrates no obstructive uropathy or other obvious intra-abdominal abnormality.  Blood cultures and urine culture are pending.  Procalcitonin is elevated although not "dangerously" so.  However given the patient's splenectomy and septic criteria without an obvious source I talked with the patient and his mother about admission and they agree.  Consulting hospitalist. [CF]  8299 Discussed case with Dr. Para March who will admit. [CF]    Clinical Course User Index [CF] Loleta Rose, MD     ____________________________________________  FINAL CLINICAL IMPRESSION(S) / ED  DIAGNOSES  Final diagnoses:  Sepsis with acute renal failure without septic shock, due to unspecified organism, unspecified acute renal failure type (HCC)  Sepsis in asplenic subject [facility])     MEDICATIONS GIVEN DURING THIS VISIT:  Medications  lactated ringers bolus 1,000 mL (1,000 mLs Intravenous New Bag/Given 10/01/20 0053)    And  lactated ringers bolus 1,000 mL (1,000 mLs Intravenous New Bag/Given 10/01/20 0101)    And  lactated ringers bolus 1,000 mL (has no administration in time range)  cefTRIAXone (ROCEPHIN) 1 g in sodium chloride 0.9 % 100 mL IVPB (1 g Intravenous New Bag/Given 10/01/20 0058)  acetaminophen (TYLENOL) tablet 1,000 mg (1,000 mg Oral Given 10/01/20 0050)     ED Discharge Orders    None      *Please note:  Jeremiah [name] was evaluated in Emergency Department on 10/01/2020 for the symptoms described in the history of present illness. He was evaluated in the context of the global COVID-19 pandemic, which necessitated consideration that the patient might be at risk for infection with the SARS-CoV-2 virus that causes COVID-19. Institutional protocols and algorithms that pertain to the evaluation of patients at risk for COVID-19 are in a state of rapid change based on information released by regulatory bodies including the CDC and federal and state organizations. These policies and algorithms were followed during the patient's care in the ED.  Some ED evaluations and interventions may be delayed as a result of limited staffing during and after the pandemic.*  Note:  This document was prepared using Dragon voice recognition software and may include unintentional dictation errors.   Loleta Rose, MD 10/01/20 [phone]

## 2020-10-01 NOTE — Plan of Care (Signed)
  Problem: Clinical Measurements: Goal: Will remain free from infection Outcome: Not Progressing   

## 2020-10-01 NOTE — Progress Notes (Signed)
Pharmacy Antibiotic Note  Jeremiah [name] is a 40 y.o. male admitted on 09/30/2020 with sepsis.  Pharmacy has been consulted for Vancomycin dosing.  Plan: Vancomycin 2 gm IV X 1 given in ED on 4/4 @ 0248. Vancomycin 1500 mg IV Q24H ordered to start on 4/5 @ 0300.  AUC = 468.8 Vanc trough = 10.8  Height: 5\' 11"  (180.3 cm) Weight: 87.5 kg (193 lb) IBW/kg (Calculated) : 75.3  Temp (24hrs), Avg:99.7 F (37.6 C), Min:98.5 F (36.9 C), Max:100.8 F (38.2 C)  Recent Labs  Lab 09/30/20 2127 10/01/20 0023  WBC 17.0*  --   CREATININE 1.89*  --   LATICACIDVEN 0.9 1.2    Estimated Creatinine Clearance: 55.9 mL/min (A) (by C-G formula based on SCr of 1.89 mg/dL (H)).    Allergies  Allergen Reactions  . Aspirin     Antimicrobials this admission:   >>    >>   Dose adjustments this admission:   Microbiology results:  BCx:   UCx:    Sputum:    MRSA PCR:   Thank you for allowing pharmacy to be a part of this patient's care.  Brandolyn Shortridge D 10/01/2020 3:52 AM

## 2020-10-01 NOTE — Progress Notes (Addendum)
PROGRESS NOTE    Jeremiah [name]  CHE:[id number] DOB: [birth year] DOA: 09/30/2020 PCP: Maple Hudson., MD   Brief Narrative: Jeremiah [name] is a 40 y.o. 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 Patient presented secondary to fever, malaise and congestion and found to have signs of sepsis. Empiric antibiotics initiated and cultures obtained.   Assessment & Plan:   Active Problems:   Sepsis (HCC)   History of splenectomy   AKI (acute kidney injury) (HCC)   Elevated BP without diagnosis of hypertension   URI (upper respiratory infection)   History of cochlear implant   Sepsis Complicated by history of splenectomy. Present on admission. URI is a possible source. Also possibly GI etiology with new diarrhea. Blood and urine cultures obtained. Vancomycin and ceftriaxone started on admission. Procalcitonin mildly elevated. Significantly worsened leukocytosis this morning. -Continue Vancomycin/Ceftriaxone -Follow blood/urine cultures -C. Difficile and GI pathogen panel testing in setting of diarrhea and leukocytosis  AKI Unsure of etiology but possibly ATN in setting of infection. No reliable baseline. Last known baseline of 1.1 from 4 years prior. Creatinine of 1.89 on admission with acute worsening since admission. Complicated by diarrhea. Urinalysis with significant proteinuria, hematuria, pyuria. -Strict in/out and daily weights -Urine sodium, creatinine, osmolality -If worsening, nephrology consult  Elevated blood pressure No history of hypertension. Will need outpatient follow-up. If BP worsens, will start antihypertensives.  Conjunctivitis Appears improved. Patient was on Tobradex as an outpatient. -Continue Tobradex BID x 2 days to complete 4 days  DVT prophylaxis: Lovenox Code Status:   Code Status: Full Code Family Communication: Father and mother at bedside Disposition Plan: Discharge likely in 2+ days pending sepsis workup,  transition to oral antibiotics, improvement of AKI   Consultants:   None  Procedures:   None  Antimicrobials:  Vancomycin  Ceftriaxone    Subjective: No concerns today. Multiple episode of watery diarrhea. No sneezing, rhinorrhea, cough, etc.  Objective: Vitals:   10/01/20 0328 10/01/20 0400 10/01/20 0440 10/01/20 0828  BP:  (!) 142/96 (!) 131/95 132/84  Pulse:  (!) 101 99 76  Resp:   18 16  Temp: 98.5 F (36.9 C)  98.3 F (36.8 C) 98.4 F (36.9 C)  TempSrc: Oral   Oral  SpO2:  96% 96% 99%  Weight:      Height:        Intake/Output Summary (Last 24 hours) at 10/01/2020 1222 Last data filed at 10/01/2020 1034 Gross per 24 hour  Intake 3798.66 ml  Output --  Net 3798.66 ml   Filed Weights   09/30/20 2116  Weight: 87.5 kg    Examination:  General exam: Appears calm and comfortable and in no acute distress. Conversant Respiratory: Clear to auscultation. Respiratory effort normal with no intercostal retractions or use of accessory muscles Cardiovascular: S1 & S2 heard, RRR. No murmurs, rubs, gallops or clicks. No edema Gastrointestinal: Abdomen is nondistended, soft and nontender. No masses felt. Normal bowel sounds heard Neurologic: No focal neurological deficits Musculoskeletal: No calf tenderness Skin: No cyanosis. No new rashes Psychiatry: Alert and oriented. Memory intact. Mood & affect appropriate    Data Reviewed: I have personally reviewed following labs and imaging studies  CBC Lab Results  Component Value Date   WBC 31.8 (H) 10/01/2020   RBC 3.81 (L) 10/01/2020   HGB 11.0 (L) 10/01/2020   HCT 31.5 (L) 10/01/2020   MCV 82.7 10/01/2020   MCH 28.9 10/01/2020   PLT 224 10/01/2020  MCHC 34.9 10/01/2020   RDW 15.8 (H) 10/01/2020   LYMPHSABS 0.8 09/30/2020   MONOABS 0.1 09/30/2020   EOSABS 0.3 09/30/2020   BASOSABS 0.1 09/30/2020     Last metabolic panel Lab Results  Component Value Date   NA 136 09/30/2020   K 3.7 09/30/2020   CL 106  09/30/2020   CO2 23 09/30/2020   BUN 28 (H) 09/30/2020   CREATININE 2.07 (H) 10/01/2020   GLUCOSE 99 09/30/2020   GFRNONAA 41 (L) 10/01/2020   GFRAA 100 10/17/2016   CALCIUM 8.2 (L) 09/30/2020   PROT 7.1 09/30/2020   ALBUMIN 3.3 (L) 09/30/2020   LABGLOB 2.6 10/17/2016   AGRATIO 1.7 10/17/2016   BILITOT 0.9 09/30/2020   ALKPHOS 99 09/30/2020   AST 19 09/30/2020   ALT 13 09/30/2020   ANIONGAP 7 09/30/2020    CBG (last 3)  No results for input(s): GLUCAP in the last 72 hours.   GFR: Estimated Creatinine Clearance: 51 mL/min (A) (by C-G formula based on SCr of 2.07 mg/dL (H)).  Coagulation Profile: No results for input(s): INR, PROTIME in the last 168 hours.  Recent Results (from the past 240 hour(s))  Resp Panel by RT-PCR (Flu A&B, Covid) Nasopharyngeal Swab     Status: None   Collection Time: 10/01/20 12:23 AM   Specimen: Nasopharyngeal Swab; Nasopharyngeal(NP) swabs in vial transport medium  Result Value Ref Range Status   SARS Coronavirus 2 by RT PCR NEGATIVE NEGATIVE Final    Comment: (NOTE) SARS-CoV-2 target nucleic acids are NOT DETECTED.  The SARS-CoV-2 RNA is generally detectable in upper respiratory specimens during the acute phase of infection. The lowest concentration of SARS-CoV-2 viral copies this assay can detect is 138 copies/mL. A negative result does not preclude SARS-Cov-2 infection and should not be used as the sole basis for treatment or other patient management decisions. A negative result may occur with  improper specimen collection/handling, submission of specimen other than nasopharyngeal swab, presence of viral mutation(s) within the areas targeted by this assay, and inadequate number of viral copies(<138 copies/mL). A negative result must be combined with clinical observations, patient history, and epidemiological information. The expected result is Negative.  Fact Sheet for Patients:  BloggerCourse.com  Fact Sheet  for Healthcare Providers:  SeriousBroker.it  This test is no t yet approved or cleared by the [place] FDA and  has been authorized for detection and/or diagnosis of SARS-CoV-2 by FDA under an Emergency Use Authorization (EUA). This EUA will remain  in effect (meaning this test can be used) for the duration of the COVID-19 declaration under Section 564(b)(1) of the Act, 21 U.S.C.section 360bbb-3(b)(1), unless the authorization is terminated  or revoked sooner.       Influenza A by PCR NEGATIVE NEGATIVE Final   Influenza B by PCR NEGATIVE NEGATIVE Final    Comment: (NOTE) The Xpert Xpress SARS-CoV-2/FLU/RSV plus assay is intended as an aid in the diagnosis of influenza from Nasopharyngeal swab specimens and should not be used as a sole basis for treatment. Nasal washings and aspirates are unacceptable for Xpert Xpress SARS-CoV-2/FLU/RSV testing.  Fact Sheet for Patients: BloggerCourse.com  Fact Sheet for Healthcare Providers: SeriousBroker.it  This test is not yet approved or cleared by the [place] FDA and has been authorized for detection and/or diagnosis of SARS-CoV-2 by FDA under an Emergency Use Authorization (EUA). This EUA will remain in effect (meaning this test can be used) for the duration of the COVID-19 declaration under Section 564(b)(1) of the Act,  21 U.S.C. section 360bbb-3(b)(1), unless the authorization is terminated or revoked.  Performed at [facility], [address] [place], [place] [address]   Blood culture (routine x 2)     Status: None (Preliminary result)   Collection Time: 10/01/20 12:36 AM   Specimen: BLOOD  Result Value Ref Range Status   Specimen Description BLOOD  RIGHT HAND  Final   Special Requests   Final    BOTTLES DRAWN AEROBIC AND ANAEROBIC Blood Culture adequate volume   Culture   Final    NO GROWTH < 12 HOURS Performed at [facility], [address] [place], [place] [address]    Report Status PENDING  Incomplete  Blood culture (routine x 2)     Status: None (Preliminary result)   Collection Time: 10/01/20 12:57 AM   Specimen: BLOOD  Result Value Ref Range Status   Specimen Description BLOOD LEFT AC  Final   Special Requests   Final    BOTTLES DRAWN AEROBIC AND ANAEROBIC Blood Culture adequate volume   Culture   Final    NO GROWTH < 12 HOURS Performed at [facility], [address] [place], [place] [address]    Report Status PENDING  Incomplete        Radiology Studies: DG Chest 2 View  Result Date: 09/30/2020 CLINICAL DATA:  Congestion EXAM: CHEST - 2 VIEW COMPARISON:  Nov 20, 2016 FINDINGS: The heart size and mediastinal contours are within normal limits. Both lungs are clear. The visualized skeletal structures are unremarkable. IMPRESSION: No active cardiopulmonary disease. Electronically Signed   By: Katherine Mantle M.D.   On: 09/30/2020 22:12   CT Renal Stone Study  Result Date: 10/01/2020 CLINICAL DATA:  Hematuria EXAM: CT ABDOMEN AND PELVIS WITHOUT CONTRAST TECHNIQUE: Multidetector CT imaging of the abdomen and pelvis was performed following the standard protocol without IV contrast. COMPARISON:  CT pelvis 08/07/2014 FINDINGS: Lower chest: The lung bases are clear. Hepatobiliary: No focal liver abnormality is seen. No gallstones, gallbladder wall thickening, or biliary dilatation. Pancreas: Unremarkable. No pancreatic ductal dilatation or surrounding inflammatory changes. 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 Adrenals/Urinary Tract: Adrenal glands are unremarkable. Kidneys are normal, without renal calculi, focal lesion, or hydronephrosis. Bladder is unremarkable. Stomach/Bowel: Stomach is within normal limits. Appendix appears normal. No evidence of bowel wall thickening, distention, or inflammatory changes. Vascular/Lymphatic: No significant vascular  findings are present. No enlarged abdominal or pelvic lymph nodes. Reproductive: Uterus and bilateral adnexa are unremarkable. Other: No abdominal wall hernia or abnormality. No abdominopelvic ascites. Musculoskeletal: No acute or significant osseous findings. IMPRESSION: No acute process demonstrated in the abdomen or pelvis. No renal or ureteral stone or obstruction. Absence of the spleen. Electronically Signed   By: Burman Nieves M.D.   On: 10/01/2020 00:59        Scheduled Meds: . enoxaparin (LOVENOX) injection  40 mg Subcutaneous Q24H  . tobramycin-dexamethasone  2 drop Both Eyes BID   Continuous Infusions: . sodium chloride 100 mL/hr at 10/01/20 0627  . cefTRIAXone (ROCEPHIN)  IV Stopped (10/01/20 0153)  . [START ON 10/02/2020] vancomycin       LOS: 0 days     Jacquelin Hawking, MD [facility] 10/01/2020, 12:22 PM  If 7PM-7AM, please contact night-coverage [email or web address]

## 2020-10-02 LAB — BASIC METABOLIC PANEL
Anion gap: 7 (ref 5–15)
BUN: 32 mg/dL — ABNORMAL HIGH (ref 6–20)
CO2: 22 mmol/L (ref 22–32)
Calcium: 7.9 mg/dL — ABNORMAL LOW (ref 8.9–10.3)
Chloride: 110 mmol/L (ref 98–111)
Creatinine, Ser: 1.99 mg/dL — ABNORMAL HIGH (ref 0.61–1.24)
GFR, Estimated: 43 mL/min — ABNORMAL LOW (ref >60)
Glucose, Bld: 99 mg/dL (ref 70–99)
Potassium: 4.1 mmol/L (ref 3.5–5.1)
Sodium: 139 mmol/L (ref 135–145)

## 2020-10-02 LAB — CBC
HCT: 28.5 % — ABNORMAL LOW (ref 39.0–52.0)
Hemoglobin: 9.7 g/dL — ABNORMAL LOW (ref 13.0–17.0)
MCH: 28.7 pg (ref 26.0–34.0)
MCHC: 34 g/dL (ref 30.0–36.0)
MCV: 84.3 fL (ref 80.0–100.0)
Platelets: 194 10*3/uL (ref 150–400)
RBC: 3.38 MIL/uL — ABNORMAL LOW (ref 4.22–5.81)
RDW: 16.5 % — ABNORMAL HIGH (ref 11.5–15.5)
WBC: 28.3 10*3/uL — ABNORMAL HIGH (ref 4.0–10.5)
nRBC: 0.1 % (ref 0.0–0.2)

## 2020-10-02 LAB — URINE CULTURE: Culture: NO GROWTH

## 2020-10-02 LAB — PATHOLOGIST SMEAR REVIEW

## 2020-10-02 MED ORDER — AMLODIPINE BESYLATE 5 MG PO TABS
5.0000 mg | ORAL_TABLET | Freq: Once | ORAL | Status: AC
  Administered 2020-10-02: 5 mg via ORAL
  Filled 2020-10-02: qty 1

## 2020-10-02 MED ORDER — AMLODIPINE BESYLATE 10 MG PO TABS
10.0000 mg | ORAL_TABLET | Freq: Every day | ORAL | Status: AC
  Administered 2020-10-03: 10 mg via ORAL
  Filled 2020-10-02: qty 1

## 2020-10-02 MED ORDER — HYDRALAZINE HCL 50 MG PO TABS
50.0000 mg | ORAL_TABLET | Freq: Three times a day (TID) | ORAL | Status: DC
  Administered 2020-10-02 – 2020-10-05 (×9): 50 mg via ORAL
  Filled 2020-10-02 (×9): qty 1

## 2020-10-02 MED ORDER — HYDRALAZINE HCL 25 MG PO TABS: 25.0000 mg | ORAL_TABLET | Freq: Four times a day (QID) | ORAL | Status: DC | PRN

## 2020-10-02 MED ORDER — HYDRALAZINE HCL 20 MG/ML IJ SOLN
10.0000 mg | Freq: Four times a day (QID) | INTRAMUSCULAR | Status: DC | PRN
  Administered 2020-10-02: 10 mg via INTRAVENOUS
  Filled 2020-10-02: qty 1

## 2020-10-02 MED ORDER — HYDRALAZINE HCL 25 MG PO TABS
25.0000 mg | ORAL_TABLET | Freq: Three times a day (TID) | ORAL | Status: DC
  Administered 2020-10-02 (×2): 25 mg via ORAL
  Filled 2020-10-02 (×2): qty 1

## 2020-10-02 MED ORDER — AMLODIPINE BESYLATE 5 MG PO TABS
5.0000 mg | ORAL_TABLET | Freq: Every day | ORAL | Status: DC
  Administered 2020-10-02: 5 mg via ORAL
  Filled 2020-10-02: qty 1

## 2020-10-02 NOTE — Progress Notes (Signed)
PROGRESS NOTE    Jeremiah [name]  EXH:[id number] DOB: 03/14/1981 DOA: 09/30/2020 PCP: Maple Hudson., MD   Brief Narrative: Jeremiah [name] is a 40 y.o. 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 Patient presented secondary to fever, malaise and congestion and found to have signs of sepsis. Empiric antibiotics initiated and cultures obtained. So far workup is negative.   Assessment & Plan:   Active Problems:   Sepsis (HCC)   History of splenectomy   AKI (acute kidney injury) (HCC)   Elevated BP without diagnosis of hypertension   URI (upper respiratory infection)   History of cochlear implant   Sepsis Complicated by history of splenectomy. Present on admission. URI is a possible source. Also possibly GI etiology with new diarrhea but GI pathogen panel and C. Difficile negative. Blood and urine cultures obtained. Vancomycin and ceftriaxone started on admission. Procalcitonin mildly elevated. Leukocytosis improved slightly. Urine culture negative. -Continue Vancomycin/Ceftriaxone pending negative blood cultures x 48 hours -Follow blood cultures  AKI Unsure of etiology but possibly ATN in setting of infection. No reliable baseline. Last known baseline of 1.1 from 4 years prior. Creatinine of 1.89 on admission with acute worsening since admission. Complicated by diarrhea. Urinalysis with significant proteinuria, hematuria, pyuria likely related to ATN. Improving. -Strict in/out and daily weights  Elevated blood pressure No history of hypertension. Will need outpatient follow-up. If BP worsens, will start antihypertensives.  Conjunctivitis Appears improved. Patient was on Tobradex as an outpatient. -Continue Tobradex BID x 2 days to complete 4 days  DVT prophylaxis: Lovenox Code Status:   Code Status: Full Code Family Communication: Father and mother at bedside Disposition Plan: Discharge likely in 24 hours pending sepsis workup,  transition to oral antibiotics if needed, improvement of AKI   Consultants:   None  Procedures:   None  Antimicrobials:  Vancomycin  Ceftriaxone    Subjective: Feeling better today.  Objective: Vitals:   10/02/20 0800 10/02/20 1120 10/02/20 1335 10/02/20 1438  BP: (!) 167/104 (!) 172/96 (!) 173/109 (!) 178/107  Pulse: 93 92 95 96  Resp: 18 20 16 18   Temp: 98.2 F (36.8 C) 98.3 F (36.8 C) 98.3 F (36.8 C) 98.3 F (36.8 C)  TempSrc: Oral Oral Oral Oral  SpO2: 95% 98% 100% 100%  Weight:      Height:        Intake/Output Summary (Last 24 hours) at 10/02/2020 1442 Last data filed at 10/02/2020 1402 Gross per 24 hour  Intake 3513.53 ml  Output 2225 ml  Net 1288.53 ml   Filed Weights   09/30/20 2116  Weight: 87.5 kg    Examination:  General exam: Appears calm and comfortable Respiratory system: Clear to auscultation. Respiratory effort normal. Cardiovascular system: S1 & S2 heard, RRR. No murmurs, rubs, gallops or clicks. Gastrointestinal system: Abdomen is nondistended, soft and nontender. No organomegaly or masses felt. Normal bowel sounds heard. Central nervous system: Alert and oriented. No focal neurological deficits. Musculoskeletal: No edema. No calf tenderness Skin: No cyanosis. No rashes Psychiatry: Judgement and insight appear normal. Mood & affect appropriate.     Data Reviewed: I have personally reviewed following labs and imaging studies  CBC Lab Results  Component Value Date   WBC 28.3 (H) 10/02/2020   RBC 3.38 (L) 10/02/2020   HGB 9.7 (L) 10/02/2020   HCT 28.5 (L) 10/02/2020   MCV 84.3 10/02/2020   MCH 28.7 10/02/2020   PLT 194 10/02/2020   MCHC  34.0 10/02/2020   RDW 16.5 (H) 10/02/2020   LYMPHSABS 0.8 09/30/2020   MONOABS 0.1 09/30/2020   EOSABS 0.3 09/30/2020   BASOSABS 0.1 09/30/2020     Last metabolic panel Lab Results  Component Value Date   NA 139 10/02/2020   K 4.1 10/02/2020   CL 110 10/02/2020   CO2 22 10/02/2020    BUN 32 (H) 10/02/2020   CREATININE 1.99 (H) 10/02/2020   GLUCOSE 99 10/02/2020   GFRNONAA 43 (L) 10/02/2020   GFRAA 100 10/17/2016   CALCIUM 7.9 (L) 10/02/2020   PROT 7.1 09/30/2020   ALBUMIN 3.3 (L) 09/30/2020   LABGLOB 2.6 10/17/2016   AGRATIO 1.7 10/17/2016   BILITOT 0.9 09/30/2020   ALKPHOS 99 09/30/2020   AST 19 09/30/2020   ALT 13 09/30/2020   ANIONGAP 7 10/02/2020    CBG (last 3)  No results for input(s): GLUCAP in the last 72 hours.   GFR: Estimated Creatinine Clearance: 53.1 mL/min (A) (by C-G formula based on SCr of 1.99 mg/dL (H)).  Coagulation Profile: No results for input(s): INR, PROTIME in the last 168 hours.  Recent Results (from the past 240 hour(s))  Urine Culture     Status: None   Collection Time: 09/30/20  9:25 PM   Specimen: Urine, Random  Result Value Ref Range Status   Specimen Description   Final    URINE, RANDOM Performed at [facility], [address] [place], [place] [address]    Special Requests   Final    NONE Performed at [facility], [address] [place], [place] [address]    Culture   Final    NO GROWTH Performed at [facility], [address] [place], [place] [address]    Report Status 10/02/2020 FINAL  Final  Resp Panel by RT-PCR (Flu A&B, Covid) Nasopharyngeal Swab     Status: None   Collection Time: 10/01/20 12:23 AM   Specimen: Nasopharyngeal Swab; Nasopharyngeal(NP) swabs in vial transport medium  Result Value Ref Range Status   SARS Coronavirus 2 by RT PCR NEGATIVE NEGATIVE Final    Comment: (NOTE) SARS-CoV-2 target nucleic acids are NOT DETECTED.  The SARS-CoV-2 RNA is generally detectable in upper respiratory specimens during the acute phase of infection. The lowest concentration of SARS-CoV-2 viral copies this assay can detect is 138 copies/mL. A negative result does not preclude SARS-Cov-2 infection and should not be used as the sole basis for treatment or other patient management  decisions. A negative result may occur with  improper specimen collection/handling, submission of specimen other than nasopharyngeal swab, presence of viral mutation(s) within the areas targeted by this assay, and inadequate number of viral copies(<138 copies/mL). A negative result must be combined with clinical observations, patient history, and epidemiological information. The expected result is Negative.  Fact Sheet for Patients:  BloggerCourse.comhttps://www.fda.gov/media/152166/download  Fact Sheet for Healthcare Providers:  SeriousBroker.ithttps://www.fda.gov/media/152162/download  This test is no t yet approved or cleared by the [place] FDA and  has been authorized for detection and/or diagnosis of SARS-CoV-2 by FDA under an Emergency Use Authorization (EUA). This EUA will remain  in effect (meaning this test can be used) for the duration of the COVID-19 declaration under Section 564(b)(1) of the Act, 21 U.S.C.section 360bbb-3(b)(1), unless the authorization is terminated  or revoked sooner.       Influenza A by PCR NEGATIVE NEGATIVE Final   Influenza B by PCR NEGATIVE NEGATIVE Final    Comment: (NOTE) The Xpert Xpress  SARS-CoV-2/FLU/RSV plus assay is intended as an aid in the diagnosis of influenza from Nasopharyngeal swab specimens and should not be used as a sole basis for treatment. Nasal washings and aspirates are unacceptable for Xpert Xpress SARS-CoV-2/FLU/RSV testing.  Fact Sheet for Patients: BloggerCourse.com  Fact Sheet for Healthcare Providers: SeriousBroker.it  This test is not yet approved or cleared by the [place] FDA and has been authorized for detection and/or diagnosis of SARS-CoV-2 by FDA under an Emergency Use Authorization (EUA). This EUA will remain in effect (meaning this test can be used) for the duration of the COVID-19 declaration under Section 564(b)(1) of the Act, 21 U.S.C. section 360bbb-3(b)(1), unless the  authorization is terminated or revoked.  Performed at [facility], [address] [place], [place] [address]   Blood culture (routine x 2)     Status: None (Preliminary result)   Collection Time: 10/01/20 12:36 AM   Specimen: BLOOD  Result Value Ref Range Status   Specimen Description BLOOD  RIGHT HAND  Final   Special Requests   Final    BOTTLES DRAWN AEROBIC AND ANAEROBIC Blood Culture adequate volume   Culture   Final    NO GROWTH 1 DAY Performed at [facility], [address], [place], [place] [address]    Report Status PENDING  Incomplete  Blood culture (routine x 2)     Status: None (Preliminary result)   Collection Time: 10/01/20 12:57 AM   Specimen: BLOOD  Result Value Ref Range Status   Specimen Description BLOOD LEFT AC  Final   Special Requests   Final    BOTTLES DRAWN AEROBIC AND ANAEROBIC Blood Culture adequate volume   Culture   Final    NO GROWTH 1 DAY Performed at [facility], [address], [place], [place] [address]    Report Status PENDING  Incomplete  Gastrointestinal Panel by PCR , Stool     Status: None   Collection Time: 10/01/20 12:16 PM   Specimen: STOOL  Result Value Ref Range Status   Campylobacter species NOT DETECTED NOT DETECTED Final   Plesimonas shigelloides NOT DETECTED NOT DETECTED Final   Salmonella species NOT DETECTED NOT DETECTED Final   Yersinia enterocolitica NOT DETECTED NOT DETECTED Final   Vibrio species NOT DETECTED NOT DETECTED Final   Vibrio cholerae NOT DETECTED NOT DETECTED Final   Enteroaggregative E coli (EAEC) NOT DETECTED NOT DETECTED Final   Enteropathogenic E coli (EPEC) NOT DETECTED NOT DETECTED Final   Enterotoxigenic E coli (ETEC) NOT DETECTED NOT DETECTED Final   Shiga like toxin producing E coli (STEC) NOT DETECTED NOT DETECTED Final   Shigella/Enteroinvasive E coli (EIEC) NOT DETECTED NOT DETECTED Final   Cryptosporidium NOT DETECTED NOT DETECTED Final   Cyclospora cayetanensis  NOT DETECTED NOT DETECTED Final   Entamoeba histolytica NOT DETECTED NOT DETECTED Final   Giardia lamblia NOT DETECTED NOT DETECTED Final   Adenovirus F40/41 NOT DETECTED NOT DETECTED Final   Astrovirus NOT DETECTED NOT DETECTED Final   Norovirus GI/GII NOT DETECTED NOT DETECTED Final   Rotavirus A NOT DETECTED NOT DETECTED Final   Sapovirus (I, II, IV, and V) NOT DETECTED NOT DETECTED Final    Comment: Performed at [facility], [address] [place], [place] [address]  C Difficile Quick Screen w PCR reflex     Status: None   Collection Time: 10/01/20 12:16 PM   Specimen: STOOL  Result Value Ref Range Status   C Diff antigen NEGATIVE NEGATIVE  Final   C Diff toxin NEGATIVE NEGATIVE Final   C Diff interpretation No C. difficile detected.  Final    Comment: Performed at [facility], [address], [place], [place] [address]        Radiology Studies: DG Chest 2 View  Result Date: 09/30/2020 CLINICAL DATA:  Congestion EXAM: CHEST - 2 VIEW COMPARISON:  Nov 20, 2016 FINDINGS: The heart size and mediastinal contours are within normal limits. Both lungs are clear. The visualized skeletal structures are unremarkable. IMPRESSION: No active cardiopulmonary disease. Electronically Signed   By: Katherine Mantle M.D.   On: 09/30/2020 22:12   CT Renal Stone Study  Result Date: 10/01/2020 CLINICAL DATA:  Hematuria EXAM: CT ABDOMEN AND PELVIS WITHOUT CONTRAST TECHNIQUE: Multidetector CT imaging of the abdomen and pelvis was performed following the standard protocol without IV contrast. COMPARISON:  CT pelvis 08/07/2014 FINDINGS: Lower chest: The lung bases are clear. Hepatobiliary: No focal liver abnormality is seen. No gallstones, gallbladder wall thickening, or biliary dilatation. Pancreas: Unremarkable. No pancreatic ductal dilatation or surrounding inflammatory changes. 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 Adrenals/Urinary Tract: Adrenal glands are  unremarkable. Kidneys are normal, without renal calculi, focal lesion, or hydronephrosis. Bladder is unremarkable. Stomach/Bowel: Stomach is within normal limits. Appendix appears normal. No evidence of bowel wall thickening, distention, or inflammatory changes. Vascular/Lymphatic: No significant vascular findings are present. No enlarged abdominal or pelvic lymph nodes. Reproductive: Uterus and bilateral adnexa are unremarkable. Other: No abdominal wall hernia or abnormality. No abdominopelvic ascites. Musculoskeletal: No acute or significant osseous findings. IMPRESSION: No acute process demonstrated in the abdomen or pelvis. No renal or ureteral stone or obstruction. Absence of the spleen. Electronically Signed   By: Burman Nieves M.D.   On: 10/01/2020 00:59        Scheduled Meds: . [START ON 10/03/2020] amLODipine  10 mg Oral Daily  . enoxaparin (LOVENOX) injection  40 mg Subcutaneous Q24H  . hydrALAZINE  25 mg Oral Q8H  . tobramycin-dexamethasone  2 drop Both Eyes BID   Continuous Infusions: . sodium chloride 100 mL/hr at 10/02/20 1249  . cefTRIAXone (ROCEPHIN)  IV Stopped (10/01/20 2345)  . vancomycin Stopped (10/02/20 0530)     LOS: 1 day     Jacquelin Hawking, MD [facility] 10/02/2020, 2:42 PM  If 7PM-7AM, please contact night-coverage [email or web address]

## 2020-10-03 ENCOUNTER — Inpatient Hospital Stay: Payer: BC Managed Care – PPO

## 2020-10-03 DIAGNOSIS — J069 Acute upper respiratory infection, unspecified: Secondary | ICD-10-CM

## 2020-10-03 LAB — RETICULOCYTES
Immature Retic Fract: 8.6 % (ref 2.3–15.9)
RBC.: 3.58 MIL/uL — ABNORMAL LOW (ref 4.22–5.81)
Retic Count, Absolute: 89.1 10*3/uL (ref 19.0–186.0)
Retic Ct Pct: 2.5 % (ref 0.4–3.1)

## 2020-10-03 LAB — URINALYSIS, ROUTINE W REFLEX MICROSCOPIC
Bacteria, UA: NONE SEEN
Bilirubin Urine: NEGATIVE
Glucose, UA: NEGATIVE mg/dL
Ketones, ur: NEGATIVE mg/dL
Nitrite: NEGATIVE
Protein, ur: 100 mg/dL — AB
Specific Gravity, Urine: 1.003 — ABNORMAL LOW (ref 1.005–1.030)
pH: 5 (ref 5.0–8.0)

## 2020-10-03 LAB — HEPATITIS B SURFACE ANTIGEN: Hepatitis B Surface Ag: NONREACTIVE

## 2020-10-03 LAB — BASIC METABOLIC PANEL
Anion gap: 7 (ref 5–15)
BUN: 24 mg/dL — ABNORMAL HIGH (ref 6–20)
CO2: 19 mmol/L — ABNORMAL LOW (ref 22–32)
Calcium: 7.7 mg/dL — ABNORMAL LOW (ref 8.9–10.3)
Chloride: 111 mmol/L (ref 98–111)
Creatinine, Ser: 1.93 mg/dL — ABNORMAL HIGH (ref 0.61–1.24)
GFR, Estimated: 45 mL/min — ABNORMAL LOW (ref >60)
Glucose, Bld: 89 mg/dL (ref 70–99)
Potassium: 3.5 mmol/L (ref 3.5–5.1)
Sodium: 137 mmol/L (ref 135–145)

## 2020-10-03 LAB — FOLATE: Folate: 9.5 ng/mL (ref >5.9)

## 2020-10-03 LAB — CBC
HCT: 27.4 % — ABNORMAL LOW (ref 39.0–52.0)
Hemoglobin: 9.5 g/dL — ABNORMAL LOW (ref 13.0–17.0)
MCH: 28.9 pg (ref 26.0–34.0)
MCHC: 34.7 g/dL (ref 30.0–36.0)
MCV: 83.3 fL (ref 80.0–100.0)
Platelets: 187 10*3/uL (ref 150–400)
RBC: 3.29 MIL/uL — ABNORMAL LOW (ref 4.22–5.81)
RDW: 16.4 % — ABNORMAL HIGH (ref 11.5–15.5)
WBC: 23.8 10*3/uL — ABNORMAL HIGH (ref 4.0–10.5)
nRBC: 0.1 % (ref 0.0–0.2)

## 2020-10-03 LAB — PROTEIN / CREATININE RATIO, URINE
Creatinine, Urine: 25 mg/dL
Protein Creatinine Ratio: 5.56 mg/mg{Cre} — ABNORMAL HIGH (ref 0.00–0.15)
Total Protein, Urine: 139 mg/dL

## 2020-10-03 LAB — RESP PANEL BY RT-PCR (FLU A&B, COVID) ARPGX2
Influenza A by PCR: NEGATIVE
Influenza B by PCR: NEGATIVE
SARS Coronavirus 2 by RT PCR: NEGATIVE

## 2020-10-03 LAB — HIV ANTIBODY (ROUTINE TESTING W REFLEX): HIV Screen 4th Generation wRfx: NONREACTIVE

## 2020-10-03 LAB — TYPE AND SCREEN
ABO/RH(D): O POS
Antibody Screen: NEGATIVE

## 2020-10-03 LAB — IRON AND TIBC
Iron: 12 ug/dL — ABNORMAL LOW (ref 45–182)
Saturation Ratios: 5 % — ABNORMAL LOW (ref 17.9–39.5)
TIBC: 267 ug/dL (ref 250–450)
UIBC: 255 ug/dL

## 2020-10-03 LAB — FERRITIN: Ferritin: 346 ng/mL — ABNORMAL HIGH (ref 24–336)

## 2020-10-03 LAB — HEPATITIS B CORE ANTIBODY, TOTAL: Hep B Core Total Ab: NONREACTIVE

## 2020-10-03 MED ORDER — IRBESARTAN 150 MG PO TABS
75.0000 mg | ORAL_TABLET | Freq: Every day | ORAL | Status: DC
  Administered 2020-10-03 – 2020-10-04 (×2): 75 mg via ORAL
  Filled 2020-10-03 (×2): qty 1

## 2020-10-03 MED ORDER — MELATONIN 5 MG PO TABS
5.0000 mg | ORAL_TABLET | Freq: Every day | ORAL | Status: DC
  Administered 2020-10-03 – 2020-10-04 (×2): 5 mg via ORAL
  Filled 2020-10-03 (×2): qty 1

## 2020-10-03 MED ORDER — LACTATED RINGERS IV SOLN: INTRAVENOUS | Status: DC

## 2020-10-03 NOTE — Consult Note (Signed)
Family made me aware of hospitalization due to fever of unknown origin and acute kidney injury.  Longstanding patient of ENT due to history of hearing loss requiring cochlear implants.  Recently seen as outpatient with recent diagnosis of severe obstructive sleep apnea and acute conjunctivitis/allergic rhinitis.  Patient reports significant headache in days prior to admission with pain surrounding cochlear implant site.  No obvious source of cutaneous infection.  Given lack of source of fever/leukocytosis at this point, CT of temporal bone order placed.  Discussed with Dr. Thedore Mins of Nephrology as well.  Covered currently for Rocephin.  Acute onset of symptoms shortly after conjunctivitis/sinusitis in unvaccinated patient for COVID but negative PCR in ER.  Will follow results of CT scan.  Plan is for biopsy of kidney tomorrow depending on results of CT scan.  Will follow along and assist if possible.

## 2020-10-03 NOTE — Plan of Care (Signed)
Pt Axox4. Calm and cooperative and able to voice his needs. No complaints of pain. Low grade Temperature noted. Vitals stable. Will continue to monitor.   Problem: Clinical Measurements: Goal: Will remain free from infection Outcome: Progressing Goal: Diagnostic test results will improve Outcome: Progressing

## 2020-10-03 NOTE — Progress Notes (Signed)
PROGRESS NOTE    Jeremiah [name]  CXK:[id number] DOB: 1980/10/04 DOA: 09/30/2020 PCP: Maple Hudson., MD   Brief Narrative: Taken from prior notes. Jeremiah [name] is a 40 y.o. 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  Congenital hearing loss s/p cochlear implant, prior history of gross hematuria after getting some viral infection which was followed up by urology for about 24-month, admitted with complaint of fever, chills, congestion and headache. Initially admitted with diagnosis of sepsis but all cultures remain negative.  Sepsis ruled out as there is no obvious source of infection. Also had AKI with baseline creatinine of 1.1 checked in 2018.  UA with significant proteinuria, hematuria and pyuria, urine cultures negative.  ATN can be a possibility but due to his history of gross hematuria with viral infection in the past, congenital hearing loss and splenectomy nephrology was also consulted.  Plan is to do a renal biopsy tomorrow.  Subjective: Patient was sitting comfortably when seen today.  No complaints.  He wants to go home.  Had some gross hematuria overnight.  Patient did not noticed any blood in his urine this morning.  Parents at bedside.  According to mother he did developed gross hematuria 5 years ago after getting a viral infection which was followed up by urology for 2 months.  Assessment & Plan:   Active Problems:   Sepsis (HCC)   History of splenectomy   AKI (acute kidney injury) (HCC)   Elevated BP without diagnosis of hypertension   URI (upper respiratory infection)   History of cochlear implant  Febrile illness.  Initial concern of sepsis with his history of splenectomy.  All cultures including C. difficile and GI pathogen panel negative.  Sepsis ruled out as there is no obvious source of infection.  Procalcitonin mildly elevated on admission.  Significant leukocytosis with some improvement. Patient received broad-spectrum  antibiotics. -Discontinue vancomycin -Continue ceftriaxone to complete a 5-day course, empirically because of his history of splenectomy, no obvious source of infection.  AKI with gross hematuria.  Creatinine was 1.1 in 2018.  Has not seen physician since then. History of having gross hematuria after getting a viral infection 5 years ago.  No history of recurrent sinus or upper respiratory infections.  Creatinine with mild improvement, at 1.93 today with bicarb of 19.  Concern of ATN, vasculitis, IgA nephropathy?? UA with significant hematuria, proteinuria and pyuria.  Urine cultures negative. Consult nephrology-repeat UA with hematuria and proteinuria, some improvement as compared to admission, remained in nephrotic range.  Urine protein and creatinine ratio markedly elevated. -Going for renal biopsy tomorrow. -Serologies ordered. -PT/APTT and INR tomorrow. -Discontinue Lovenox  Acute blood loss anemia.  Most likely with hematuria and renal dysfunction, other hematologic disorder cannot be ruled out at this time. -Check anemia panel. -Monitor CBC -Transfuse if below 7  Leukocytosis.  Some improvement but WBC remained significantly elevated at 23.8.  Pathology review of smear was without any significant abnormality. -Continue to monitor.  Hypertension.  Blood pressure remained elevated, no prior diagnosis of hypertension. -Started on irbesartan by nephrology today.  Conjunctivitis Appears improved. Patient was on Tobradex as an outpatient. -Continue Tobradex BID for 2 more days.  Objective: Vitals:   10/03/20 0601 10/03/20 0752 10/03/20 1159 10/03/20 1247  BP:  (!) 147/104 (!) 145/100 (!) 151/92  Pulse:  87 86 88  Resp:  18 16 18   Temp:  98.2 F (36.8 C) 98 F (36.7 C) 98.1 F (  36.7 C)  TempSrc:  Oral Oral   SpO2:  98% 100% 97%  Weight: 87.2 kg     Height:        Intake/Output Summary (Last 24 hours) at 10/03/2020 1522 Last data filed at 10/03/2020 1037 Gross per 24 hour   Intake 440 ml  Output 1800 ml  Net -1360 ml   Filed Weights   09/30/20 2116 10/03/20 0601  Weight: 87.5 kg 87.2 kg    Examination:  General exam: Appears calm and comfortable  Respiratory system: Clear to auscultation. Respiratory effort normal. Cardiovascular system: S1 & S2 heard, RRR. No JVD, murmurs, rubs, gallops or clicks. Gastrointestinal system: Soft, nontender, nondistended, bowel sounds positive. Central nervous system: Alert and oriented. No focal neurological deficits.Symmetric 5 x 5 power. Extremities: No edema, no cyanosis, pulses intact and symmetrical. Psychiatry: Judgement and insight appear normal. Mood & affect appropriate.    DVT prophylaxis: SCDs Code Status: Full Family Communication: Parents were updated at bedside. Disposition Plan:  Status is: Inpatient  Remains inpatient appropriate because:Inpatient level of care appropriate due to severity of illness   Dispo: The patient is from: Home              Anticipated d/c is to: Home              Patient currently is not medically stable to d/c.   Difficult to place patient No              Level of care: Med-Surg  All the records are reviewed and case discussed with Care Management/Social Worker. Management plans discussed with the patient, nursing and they are in agreement.  Consultants:   Nephrology  Procedures:  Antimicrobials:  Ceftriaxone  Data Reviewed: I have personally reviewed following labs and imaging studies  CBC: Recent Labs  Lab 09/30/20 2127 10/01/20 0507 10/02/20 0537 10/03/20 0502  WBC 17.0* 31.8* 28.3* 23.8*  NEUTROABS 15.7*  --   --   --   HGB 12.0* 11.0* 9.7* 9.5*  HCT 33.9* 31.5* 28.5* 27.4*  MCV 82.3 82.7 84.3 83.3  PLT 302 224 194 187   Basic Metabolic Panel: Recent Labs  Lab 09/30/20 2127 10/01/20 0507 10/01/20 1204 10/02/20 0537 10/03/20 0502  NA 136  --  138 139 137  K 3.7  --  4.4 4.1 3.5  CL 106  --  106 110 111  CO2 23  --  22 22 19*  GLUCOSE  99  --  134* 99 89  BUN 28*  --  31* 32* 24*  CREATININE 1.89* 2.07* 2.43* 1.99* 1.93*  CALCIUM 8.2*  --  7.5* 7.9* 7.7*   GFR: Estimated Creatinine Clearance: 54.7 mL/min (A) (by C-G formula based on SCr of 1.93 mg/dL (H)). Liver Function Tests: Recent Labs  Lab 09/30/20 2127  AST 19  ALT 13  ALKPHOS 99  BILITOT 0.9  PROT 7.1  ALBUMIN 3.3*   No results for input(s): LIPASE, AMYLASE in the last 168 hours. No results for input(s): AMMONIA in the last 168 hours. Coagulation Profile: No results for input(s): INR, PROTIME in the last 168 hours. Cardiac Enzymes: No results for input(s): CKTOTAL, CKMB, CKMBINDEX, TROPONINI in the last 168 hours. BNP (last 3 results) No results for input(s): PROBNP in the last 8760 hours. HbA1C: No results for input(s): HGBA1C in the last 72 hours. CBG: No results for input(s): GLUCAP in the last 168 hours. Lipid Profile: No results for input(s): CHOL, HDL, LDLCALC, TRIG, CHOLHDL, LDLDIRECT in the  last 72 hours. Thyroid Function Tests: No results for input(s): TSH, T4TOTAL, FREET4, T3FREE, THYROIDAB in the last 72 hours. Anemia Panel: No results for input(s): VITAMINB12, FOLATE, FERRITIN, TIBC, IRON, RETICCTPCT in the last 72 hours. Sepsis Labs: Recent Labs  Lab 09/30/20 2125 09/30/20 2127 10/01/20 0023  PROCALCITON 0.43  --   --   LATICACIDVEN  --  0.9 1.2    Recent Results (from the past 240 hour(s))  Urine Culture     Status: None   Collection Time: 09/30/20  9:25 PM   Specimen: Urine, Random  Result Value Ref Range Status   Specimen Description   Final    URINE, RANDOM Performed at [facility], [address] [place], [place] [address]    Special Requests   Final    NONE Performed at [facility], [address] [place], [place] [address]    Culture   Final    NO GROWTH Performed at [facility], [address] [place], [place] [address]    Report Status 10/02/2020 FINAL  Final  Resp Panel by  RT-PCR (Flu A&B, Covid) Nasopharyngeal Swab     Status: None   Collection Time: 10/01/20 12:23 AM   Specimen: Nasopharyngeal Swab; Nasopharyngeal(NP) swabs in vial transport medium  Result Value Ref Range Status   SARS Coronavirus 2 by RT PCR NEGATIVE NEGATIVE Final    Comment: (NOTE) SARS-CoV-2 target nucleic acids are NOT DETECTED.  The SARS-CoV-2 RNA is generally detectable in upper respiratory specimens during the acute phase of infection. The lowest concentration of SARS-CoV-2 viral copies this assay can detect is 138 copies/mL. A negative result does not preclude SARS-Cov-2 infection and should not be used as the sole basis for treatment or other patient management decisions. A negative result may occur with  improper specimen collection/handling, submission of specimen other than nasopharyngeal swab, presence of viral mutation(s) within the areas targeted by this assay, and inadequate number of viral copies(<138 copies/mL). A negative result must be combined with clinical observations, patient history, and epidemiological information. The expected result is Negative.  Fact Sheet for Patients:  BloggerCourse.comhttps://www.fda.gov/media/152166/download  Fact Sheet for Healthcare Providers:  SeriousBroker.ithttps://www.fda.gov/media/152162/download  This test is no t yet approved or cleared by the [place] FDA and  has been authorized for detection and/or diagnosis of SARS-CoV-2 by FDA under an Emergency Use Authorization (EUA). This EUA will remain  in effect (meaning this test can be used) for the duration of the COVID-19 declaration under Section 564(b)(1) of the Act, 21 U.S.C.section 360bbb-3(b)(1), unless the authorization is terminated  or revoked sooner.       Influenza A by PCR NEGATIVE NEGATIVE Final   Influenza B by PCR NEGATIVE NEGATIVE Final    Comment: (NOTE) The Xpert Xpress SARS-CoV-2/FLU/RSV plus assay is intended as an aid in the diagnosis of influenza from Nasopharyngeal swab  specimens and should not be used as a sole basis for treatment. Nasal washings and aspirates are unacceptable for Xpert Xpress SARS-CoV-2/FLU/RSV testing.  Fact Sheet for Patients: BloggerCourse.comhttps://www.fda.gov/media/152166/download  Fact Sheet for Healthcare Providers: SeriousBroker.ithttps://www.fda.gov/media/152162/download  This test is not yet approved or cleared by the [place] FDA and has been authorized for detection and/or diagnosis of SARS-CoV-2 by FDA under an Emergency Use Authorization (EUA). This EUA will remain in effect (meaning this test can be used) for the duration of the COVID-19 declaration under Section 564(b)(1) of the Act, 21 U.S.C. section 360bbb-3(b)(1), unless the authorization is terminated or revoked.  Performed at [facility], [address] [place], [place] [address]   Blood culture (routine x 2)     Status: None (Preliminary result)   Collection Time: 10/01/20 12:36 AM   Specimen: BLOOD  Result Value Ref Range Status   Specimen Description BLOOD  RIGHT HAND  Final   Special Requests   Final    BOTTLES DRAWN AEROBIC AND ANAEROBIC Blood Culture adequate volume   Culture   Final    NO GROWTH 2 DAYS Performed at [facility], [address] [place], [place] [address]    Report Status PENDING  Incomplete  Blood culture (routine x 2)     Status: None (Preliminary result)   Collection Time: 10/01/20 12:57 AM   Specimen: BLOOD  Result Value Ref Range Status   Specimen Description BLOOD LEFT AC  Final   Special Requests   Final    BOTTLES DRAWN AEROBIC AND ANAEROBIC Blood Culture adequate volume   Culture   Final    NO GROWTH 2 DAYS Performed at [facility], [address] [place], [place] [address]    Report Status PENDING  Incomplete  Gastrointestinal Panel by PCR , Stool     Status: None   Collection Time: 10/01/20 12:16 PM   Specimen: STOOL  Result Value Ref Range Status   Campylobacter species NOT DETECTED NOT DETECTED Final    Plesimonas shigelloides NOT DETECTED NOT DETECTED Final   Salmonella species NOT DETECTED NOT DETECTED Final   Yersinia enterocolitica NOT DETECTED NOT DETECTED Final   Vibrio species NOT DETECTED NOT DETECTED Final   Vibrio cholerae NOT DETECTED NOT DETECTED Final   Enteroaggregative E coli (EAEC) NOT DETECTED NOT DETECTED Final   Enteropathogenic E coli (EPEC) NOT DETECTED NOT DETECTED Final   Enterotoxigenic E coli (ETEC) NOT DETECTED NOT DETECTED Final   Shiga like toxin producing E coli (STEC) NOT DETECTED NOT DETECTED Final   Shigella/Enteroinvasive E coli (EIEC) NOT DETECTED NOT DETECTED Final   Cryptosporidium NOT DETECTED NOT DETECTED Final   Cyclospora cayetanensis NOT DETECTED NOT DETECTED Final   Entamoeba histolytica NOT DETECTED NOT DETECTED Final   Giardia lamblia NOT DETECTED NOT DETECTED Final   Adenovirus F40/41 NOT DETECTED NOT DETECTED Final   Astrovirus NOT DETECTED NOT DETECTED Final   Norovirus GI/GII NOT DETECTED NOT DETECTED Final   Rotavirus A NOT DETECTED NOT DETECTED Final   Sapovirus (I, II, IV, and V) NOT DETECTED NOT DETECTED Final    Comment: Performed at [facility], [address] [place], [place] [address]  C Difficile Quick Screen w PCR reflex     Status: None   Collection Time: 10/01/20 12:16 PM   Specimen: STOOL  Result Value Ref Range Status   C Diff antigen NEGATIVE NEGATIVE Final   C Diff toxin NEGATIVE NEGATIVE Final   C Diff interpretation No C. difficile detected.  Final    Comment: Performed at [facility], [address] [place], [place] [address]     Radiology Studies: No results found.  Scheduled Meds: . hydrALAZINE  50 mg Oral Q8H  . irbesartan  75 mg Oral Daily   Continuous Infusions: . cefTRIAXone (ROCEPHIN)  IV 1 g (10/02/20 2309)     LOS: 2 days   Time spent: 45 minutes. More than 50% of the time was spent in counseling/coordination of care  Arnetha Courser, MD [facility]  If 7PM-7AM,  please contact night-coverage [email or web address]  10/03/2020, 3:22 PM   This record has been  created using Systems analyst. Errors have been sought and corrected,but may not always be located. Such creation errors do not reflect on the standard of care.

## 2020-10-03 NOTE — Progress Notes (Signed)
Pt noted to have continued low grade temp with c/o of mild HA 3/10. Pt face felt warm to touch. Decreased room temperature, ice pack and Tylenol administered. Pt urine now looking tea colored from amber earlier at the beginning of the shift. Adequate output noted. Will continue to monitor.

## 2020-10-03 NOTE — Consult Note (Addendum)
[facility] Consult Note:    Date of Admission:  09/30/2020           Reason for Consult: AKI    Referring Provider: Arnetha CourserAmin, Sumayya, MD Primary Care Provider: Maple HudsonGilbert, Richard L Jr., MD   History of Presenting Illness:  Jeremiah NettlesJacob H [name] is a 40 y.o. 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  His father reports that patient's older brother has the same thing.  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 5 years ago he had blood in his urine.  He was evaluated by urologist and was followed for about 2 months.  It was noted after infection.   Review of Systems: ROS   Gen: Denies any fevers or chills now. Has low grade fever HEENT: No vision or hearing problems CV: No chest pain or shortness of breath Resp: No cough or sputum production GI: No nausea, vomiting or diarrhea.  No blood in the stool GU : No problems with voiding .  Pinkish urine MS: Ambulatory.  Denies any acute joint pain or swelling Derm:   No complaints Psych: No complaints Heme: No complaints Neuro: No complaints Endocrine: No complaints   History reviewed. No pertinent past medical history.  Social History   Tobacco Use  . Smoking status: Current Some Day Smoker  . Smokeless tobacco: Never Used  Substance Use Topics  . Alcohol use: No  . Drug use: No    Family History  Problem Relation Age of Onset  . Hyperlipidemia Father      OBJECTIVE: Blood pressure (!) 151/92, pulse 88, temperature 98.1 F (36.7 C), resp. rate 18, height 5\' 11"  (1.803 m), weight 87.2 kg, SpO2 97 %.   Physical Exam: General:  No acute distress,  laying in the bed  HEENT  anicteric, moist oral mucous membrane, hearing loss, implant  Pulm/lungs  normal breathing effort, lungs are clear to auscultation  CVS/Heart  regular rhythm, no rub or gallop  Abdomen:   Soft, nontender  Extremities:  No peripheral edema  Neurologic:  Alert, oriented, able to follow commands  Skin:  No acute rashes     Lab Results Lab Results  Component Value Date   WBC 23.8 (H) 10/03/2020   HGB 9.5 (L) 10/03/2020   HCT 27.4 (L) 10/03/2020   MCV 83.3 10/03/2020   PLT 187 10/03/2020    Lab Results  Component Value Date   CREATININE 1.93 (H) 10/03/2020   BUN 24 (H) 10/03/2020   NA 137 10/03/2020   K 3.5 10/03/2020   CL 111 10/03/2020   CO2 19 (L) 10/03/2020    Lab Results  Component Value Date   ALT 13 09/30/2020   AST 19 09/30/2020   ALKPHOS 99 09/30/2020   BILITOT 0.9 09/30/2020     Microbiology: Recent Results (from the past 240 hour(s))  Urine Culture     Status: None   Collection Time: 09/30/20  9:25 PM   Specimen: Urine, Random  Result Value Ref Range Status   Specimen Description   Final    URINE, RANDOM Performed at [facility], [address] [place], [place] [id number]  Special Requests   Final    NONE Performed at [facility], [address] [place], [place] [address]    Culture   Final    NO GROWTH Performed at [facility], [address], [place], [place] [address]    Report Status 10/02/2020 FINAL  Final  Resp Panel by RT-PCR (Flu A&B, Covid) Nasopharyngeal Swab     Status: None   Collection Time: 10/01/20 12:23 AM   Specimen: Nasopharyngeal Swab; Nasopharyngeal(NP) swabs in vial transport medium  Result Value Ref Range Status   SARS Coronavirus 2 by RT PCR NEGATIVE NEGATIVE Final    Comment: (NOTE) SARS-CoV-2 target nucleic acids are NOT DETECTED.  The SARS-CoV-2 RNA is generally detectable in upper respiratory specimens during the acute phase of infection. The  lowest concentration of SARS-CoV-2 viral copies this assay can detect is 138 copies/mL. A negative result does not preclude SARS-Cov-2 infection and should not be used as the sole basis for treatment or other patient management decisions. A negative result may occur with  improper specimen collection/handling, submission of specimen other than nasopharyngeal swab, presence of viral mutation(s) within the areas targeted by this assay, and inadequate number of viral copies(<138 copies/mL). A negative result must be combined with clinical observations, patient history, and epidemiological information. The expected result is Negative.  Fact Sheet for Patients:  BloggerCourse.com  Fact Sheet for Healthcare Providers:  SeriousBroker.it  This test is no t yet approved or cleared by the [place] FDA and  has been authorized for detection and/or diagnosis of SARS-CoV-2 by FDA under an Emergency Use Authorization (EUA). This EUA will remain  in effect (meaning this test can be used) for the duration of the COVID-19 declaration under Section 564(b)(1) of the Act, 21 U.S.C.section 360bbb-3(b)(1), unless the authorization is terminated  or revoked sooner.       Influenza A by PCR NEGATIVE NEGATIVE Final   Influenza B by PCR NEGATIVE NEGATIVE Final    Comment: (NOTE) The Xpert Xpress SARS-CoV-2/FLU/RSV plus assay is intended as an aid in the diagnosis of influenza from Nasopharyngeal swab specimens and should not be used as a sole basis for treatment. Nasal washings and aspirates are unacceptable for Xpert Xpress SARS-CoV-2/FLU/RSV testing.  Fact Sheet for Patients: BloggerCourse.com  Fact Sheet for Healthcare Providers: SeriousBroker.it  This test is not yet approved or cleared by the [place] FDA and has been authorized for detection and/or diagnosis of SARS-CoV-2 by FDA under  an Emergency Use Authorization (EUA). This EUA will remain in effect (meaning this test can be used) for the duration of the COVID-19 declaration under Section 564(b)(1) of the Act, 21 U.S.C. section 360bbb-3(b)(1), unless the authorization is terminated or revoked.  Performed at [facility], [address] [place], [place] [address]   Blood culture (routine x 2)     Status: None (Preliminary result)   Collection Time: 10/01/20 12:36 AM   Specimen: BLOOD  Result Value Ref Range Status   Specimen Description BLOOD  RIGHT HAND  Final   Special Requests   Final    BOTTLES DRAWN AEROBIC AND ANAEROBIC Blood Culture adequate volume   Culture   Final    NO GROWTH 2 DAYS Performed at [facility], [address], [place], [place] [address]    Report Status PENDING  Incomplete  Blood culture (routine x 2)     Status: None (Preliminary result)   Collection Time: 10/01/20 12:57 AM   Specimen: BLOOD  Result  Value Ref Range Status   Specimen Description BLOOD LEFT AC  Final   Special Requests   Final    BOTTLES DRAWN AEROBIC AND ANAEROBIC Blood Culture adequate volume   Culture   Final    NO GROWTH 2 DAYS Performed at [facility], [address] [place], [place] [address]    Report Status PENDING  Incomplete  Gastrointestinal Panel by PCR , Stool     Status: None   Collection Time: 10/01/20 12:16 PM   Specimen: STOOL  Result Value Ref Range Status   Campylobacter species NOT DETECTED NOT DETECTED Final   Plesimonas shigelloides NOT DETECTED NOT DETECTED Final   Salmonella species NOT DETECTED NOT DETECTED Final   Yersinia enterocolitica NOT DETECTED NOT DETECTED Final   Vibrio species NOT DETECTED NOT DETECTED Final   Vibrio cholerae NOT DETECTED NOT DETECTED Final   Enteroaggregative E coli (EAEC) NOT DETECTED NOT DETECTED Final   Enteropathogenic E coli (EPEC) NOT DETECTED NOT DETECTED Final   Enterotoxigenic E coli (ETEC) NOT DETECTED NOT DETECTED  Final   Shiga like toxin producing E coli (STEC) NOT DETECTED NOT DETECTED Final   Shigella/Enteroinvasive E coli (EIEC) NOT DETECTED NOT DETECTED Final   Cryptosporidium NOT DETECTED NOT DETECTED Final   Cyclospora cayetanensis NOT DETECTED NOT DETECTED Final   Entamoeba histolytica NOT DETECTED NOT DETECTED Final   Giardia lamblia NOT DETECTED NOT DETECTED Final   Adenovirus F40/41 NOT DETECTED NOT DETECTED Final   Astrovirus NOT DETECTED NOT DETECTED Final   Norovirus GI/GII NOT DETECTED NOT DETECTED Final   Rotavirus A NOT DETECTED NOT DETECTED Final   Sapovirus (I, II, IV, and V) NOT DETECTED NOT DETECTED Final    Comment: Performed at [facility], [address] [place], [place] [address]  C Difficile Quick Screen w PCR reflex     Status: None   Collection Time: 10/01/20 12:16 PM   Specimen: STOOL  Result Value Ref Range Status   C Diff antigen NEGATIVE NEGATIVE Final   C Diff toxin NEGATIVE NEGATIVE Final   C Diff interpretation No C. difficile detected.  Final    Comment: Performed at [facility], [address] [place], [place] [address]    Medications: Scheduled Meds: . enoxaparin (LOVENOX) injection  40 mg Subcutaneous Q24H  . hydrALAZINE  50 mg Oral Q8H   Continuous Infusions: . cefTRIAXone (ROCEPHIN)  IV 1 g (10/02/20 2309)  . lactated ringers 100 mL/hr at 10/03/20 1037   PRN Meds:.acetaminophen **OR** acetaminophen, hydrALAZINE, ondansetron **OR** ondansetron (ZOFRAN) IV  Allergies  Allergen Reactions  . Aspirin     Urinalysis: Recent Labs    09/30/20 2127  COLORURINE YELLOW*  LABSPEC 1.014  PHURINE 5.0  GLUCOSEU NEGATIVE  HGBUR LARGE*  BILIRUBINUR NEGATIVE  KETONESUR NEGATIVE  PROTEINUR >=300*  NITRITE NEGATIVE  LEUKOCYTESUR SMALL*      Imaging: No results found.    Assessment/Plan:  Jeremiah [name] is a 40 y.o. 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  Bladder is unremarkable. Urinalysis September 30, 2020: Large hemoglobin, greater than 300 protein, greater  than 50 RBCs, 21-50 WBCs  Plan: We will repeat urinalysis, obtain urine protein to creatinine ratio Screening serologies Discussed case scenario with patient and his father.  Patient now has hematuria and proteinuria with elevated creatinine.  Discussed possibility of needing kidney biopsy to confirm diagnosis.  Risks and benefits were explained.  Patient and his father have agreed to proceed tomorrow.  We will plan on biopsy if urinalysis confirms hematuria and urine protein to creatinine ratio is significantly elevated. Added low-dose irbesartan for better blood pressure control.      Szymon Foiles Thedore Mins 10/03/20

## 2020-10-04 ENCOUNTER — Inpatient Hospital Stay: Payer: BC Managed Care – PPO

## 2020-10-04 LAB — CBC
HCT: 27.3 % — ABNORMAL LOW (ref 39.0–52.0)
Hemoglobin: 9.7 g/dL — ABNORMAL LOW (ref 13.0–17.0)
MCH: 29 pg (ref 26.0–34.0)
MCHC: 35.5 g/dL (ref 30.0–36.0)
MCV: 81.7 fL (ref 80.0–100.0)
Platelets: 191 10*3/uL (ref 150–400)
RBC: 3.34 MIL/uL — ABNORMAL LOW (ref 4.22–5.81)
RDW: 16 % — ABNORMAL HIGH (ref 11.5–15.5)
WBC: 22.7 10*3/uL — ABNORMAL HIGH (ref 4.0–10.5)
nRBC: 0 % (ref 0.0–0.2)

## 2020-10-04 LAB — BASIC METABOLIC PANEL
Anion gap: 10 (ref 5–15)
BUN: 22 mg/dL — ABNORMAL HIGH (ref 6–20)
CO2: 20 mmol/L — ABNORMAL LOW (ref 22–32)
Calcium: 8 mg/dL — ABNORMAL LOW (ref 8.9–10.3)
Chloride: 108 mmol/L (ref 98–111)
Creatinine, Ser: 1.83 mg/dL — ABNORMAL HIGH (ref 0.61–1.24)
GFR, Estimated: 48 mL/min — ABNORMAL LOW (ref >60)
Glucose, Bld: 87 mg/dL (ref 70–99)
Potassium: 3.4 mmol/L — ABNORMAL LOW (ref 3.5–5.1)
Sodium: 138 mmol/L (ref 135–145)

## 2020-10-04 LAB — ANCA TITERS
Atypical P-ANCA titer: <1:20 {titer}
C-ANCA: <1:20 {titer}
P-ANCA: <1:20 {titer}

## 2020-10-04 LAB — ANA W/REFLEX IF POSITIVE: Anti Nuclear Antibody (ANA): NEGATIVE

## 2020-10-04 LAB — GLOMERULAR BASEMENT MEMBRANE ANTIBODIES: GBM Ab: 3 units (ref 0–20)

## 2020-10-04 LAB — PROTIME-INR
INR: 1.1 (ref 0.8–1.2)
Prothrombin Time: 14.1 seconds (ref 11.4–15.2)

## 2020-10-04 LAB — MPO/PR-3 (ANCA) ANTIBODIES
ANCA Proteinase 3: <3.5 U/mL (ref 0.0–3.5)
Myeloperoxidase Abs: <9 U/mL (ref 0.0–9.0)

## 2020-10-04 LAB — HEPATITIS B SURFACE ANTIBODY,QUALITATIVE: Hep B S Ab: NONREACTIVE

## 2020-10-04 LAB — APTT: aPTT: 47 seconds — ABNORMAL HIGH (ref 24–36)

## 2020-10-04 LAB — HEPATITIS C ANTIBODY: HCV Ab: NONREACTIVE

## 2020-10-04 LAB — C4 COMPLEMENT: Complement C4, Body Fluid: 30 mg/dL (ref 12–38)

## 2020-10-04 LAB — VITAMIN B12: Vitamin B-12: 186 pg/mL (ref 180–914)

## 2020-10-04 LAB — ANTISTREPTOLYSIN O TITER: ASO: <20 IU/mL (ref 0.0–200.0)

## 2020-10-04 LAB — C3 COMPLEMENT: C3 Complement: 149 mg/dL (ref 82–167)

## 2020-10-04 MED ORDER — FENTANYL CITRATE (PF) 100 MCG/2ML IJ SOLN
INTRAMUSCULAR | Status: AC | PRN
  Administered 2020-10-04 (×2): 50 ug via INTRAVENOUS

## 2020-10-04 MED ORDER — FENTANYL CITRATE (PF) 100 MCG/2ML IJ SOLN
INTRAMUSCULAR | Status: AC
  Filled 2020-10-04: qty 2

## 2020-10-04 MED ORDER — MIDAZOLAM HCL 2 MG/2ML IJ SOLN
INTRAMUSCULAR | Status: AC | PRN
  Administered 2020-10-04 (×2): 1 mg via INTRAVENOUS

## 2020-10-04 MED ORDER — HYDRALAZINE HCL 20 MG/ML IJ SOLN
10.0000 mg | Freq: Once | INTRAMUSCULAR | Status: AC
  Administered 2020-10-04: 10 mg via INTRAVENOUS
  Filled 2020-10-04: qty 1

## 2020-10-04 MED ORDER — HYDRALAZINE HCL 20 MG/ML IJ SOLN
INTRAMUSCULAR | Status: AC | PRN
  Administered 2020-10-04: 10 mg via INTRAVENOUS

## 2020-10-04 MED ORDER — MIDAZOLAM HCL 2 MG/2ML IJ SOLN
INTRAMUSCULAR | Status: AC
  Filled 2020-10-04: qty 2

## 2020-10-04 MED ORDER — CLONIDINE HCL 0.1 MG PO TABS
0.1000 mg | ORAL_TABLET | ORAL | Status: AC
  Administered 2020-10-04: 0.1 mg via ORAL
  Filled 2020-10-04: qty 1

## 2020-10-04 MED ORDER — HYDRALAZINE HCL 20 MG/ML IJ SOLN
INTRAMUSCULAR | Status: AC
  Filled 2020-10-04: qty 1

## 2020-10-04 MED ORDER — FERROUS SULFATE 325 (65 FE) MG PO TABS
325.0000 mg | ORAL_TABLET | Freq: Every day | ORAL | Status: DC
  Administered 2020-10-05: 325 mg via ORAL
  Filled 2020-10-04: qty 1

## 2020-10-04 MED ORDER — SODIUM CHLORIDE 0.9 % IV SOLN
510.0000 mg | Freq: Once | INTRAVENOUS | Status: AC
  Administered 2020-10-04: 510 mg via INTRAVENOUS
  Filled 2020-10-04: qty 17

## 2020-10-04 NOTE — Progress Notes (Signed)
PROGRESS NOTE    Jeremiah [name]  CZY:[id number] DOB: 21-Jul-1980 DOA: 09/30/2020 PCP: Maple Hudson., MD   Brief Narrative: Taken from prior notes. Jeremiah [name] is a 40 y.o. 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  Congenital hearing loss s/p cochlear implant, prior history of gross hematuria after getting some viral infection which was followed up by urology for about 14-month, admitted with complaint of fever, chills, congestion and headache. Initially admitted with diagnosis of sepsis but all cultures remain negative.  Sepsis ruled out as there is no obvious source of infection. Also had AKI with baseline creatinine of 1.1 checked in 2018.  UA with significant proteinuria, hematuria and pyuria, urine cultures negative.  ATN can be a possibility but due to his history of gross hematuria with viral infection in the past, congenital hearing loss and splenectomy nephrology was also consulted.  Patient underwent renal biopsy today, tolerated the procedure well.  Continue to have low-grade fever over the past 24-hour. ID was consulted today.  Subjective: Patient was seen after renal biopsy today.  Denies any pain.  Lying comfortably.  Parents at bedside.  Parents had a lot of questions regarding his recent labs.  All of their questions answered best to my ability.  Assessment & Plan:   Active Problems:   Sepsis (HCC)   History of splenectomy   Acute renal failure (HCC)   Elevated BP without diagnosis of hypertension   URI (upper respiratory infection)   History of cochlear implant  Febrile illness.  Initial concern of sepsis with his history of splenectomy.  All cultures including C. difficile and GI pathogen panel negative.  Sepsis ruled out as there is no obvious source of infection.  Procalcitonin mildly elevated on admission.  Significant leukocytosis with some improvement. Patient received broad-spectrum antibiotics.Vanc was discontinued  yesterday. Continue to have low grade fever over last 24 hours. 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  AKI with gross hematuria.  Creatinine was 1.1 in 2018.  Has not seen physician since then. History of having gross hematuria after getting a viral infection 5 years ago.  No history of recurrent sinus or upper respiratory infections.  Creatinine with mild improvement, at 1.83 today with bicarb of 22.  Concern of ATN, vasculitis, IgA nephropathy?? UA with significant hematuria, proteinuria and pyuria.  Urine cultures negative. Consult nephrology-repeat UA with hematuria and proteinuria, some improvement as compared to admission, remained in nephrotic range.  Urine protein and creatinine ratio markedly elevated. Had renal biopsy today. Hepatitis serologies, ASO, C3, C4 are within normal limits, rest of the labs pending.  Acute blood loss anemia.  Most likely with hematuria and renal dysfunction, other hematologic disorder cannot be ruled out at this time. Anemia panel with iron deficiency and normal TIBC, some element of anemia of chronic disease. -Give him one dose of feraheme. -Start him on iron supplement. -Monitor CBC -Transfuse if below 7  Leukocytosis.  Some improvement but WBC remained significantly elevated at 22.7.  Pathology review of smear was without any significant abnormality. -Continue to monitor.  Hypertension.  Blood pressure remained elevated, no prior diagnosis of hypertension. Irbesarten was started yesterday. -Continue irbesartan. -PRN Hydralazine  Conjunctivitis Appears improved. Patient was on Tobradex as an outpatient. -Continue Tobradex BID for 1 more days.  Objective: Vitals:   10/04/20 1111 10/04/20 1116 10/04/20 1129  10/04/20 1618  BP: (!) 152/88 (!) 154/88 (!) 142/89 136/86  Pulse: 98 96 (!) 104 89  Resp:   18 18  Temp:   98.5 F (36.9 C) 98.1  F (36.7 C)  TempSrc:   Oral Oral  SpO2: 100% 100% 97% 94%  Weight:      Height:        Intake/Output Summary (Last 24 hours) at 10/04/2020 1915 Last data filed at 10/04/2020 1525 Gross per 24 hour  Intake 436.51 ml  Output 1575 ml  Net -1138.49 ml   Filed Weights   09/30/20 2116 10/03/20 0601 10/04/20 0500  Weight: 87.5 kg 87.2 kg 87.6 kg    Examination:  General. Well developed gentleman, In no acute distress. Pulmonary.  Lungs clear bilaterally, normal respiratory effort. CV.  Regular rate and rhythm, no JVD, rub or murmur. Abdomen.  Soft, nontender, nondistended, BS positive. CNS.  Alert and oriented x3.  No focal neurologic deficit. Extremities.  No edema, no cyanosis, pulses intact and symmetrical. Psychiatry.  Judgment and insight appears normal.   DVT prophylaxis: SCDs Code Status: Full Family Communication: Parents were updated at bedside. Disposition Plan:  Status is: Inpatient  Remains inpatient appropriate because:Inpatient level of care appropriate due to severity of illness   Dispo: The patient is from: Home              Anticipated d/c is to: Home              Patient currently is not medically stable to d/c.   Difficult to place patient No              Level of care: Med-Surg  All the records are reviewed and case discussed with Care Management/Social Worker. Management plans discussed with the patient, nursing and they are in agreement.  Consultants:   Nephrology  Procedures:  Antimicrobials:  Ceftriaxone  Data Reviewed: I have personally reviewed following labs and imaging studies  CBC: Recent Labs  Lab 09/30/20 2127 10/01/20 0507 10/02/20 0537 10/03/20 0502 10/04/20 0554  WBC 17.0* 31.8* 28.3* 23.8* 22.7*  NEUTROABS 15.7*  --   --   --   --   HGB 12.0* 11.0* 9.7* 9.5* 9.7*  HCT 33.9* 31.5* 28.5* 27.4* 27.3*  MCV 82.3 82.7 84.3 83.3 81.7  PLT 302 224 194 187 191   Basic Metabolic Panel: Recent Labs  Lab 09/30/20 2127  10/01/20 0507 10/01/20 1204 10/02/20 0537 10/03/20 0502 10/04/20 0554  NA 136  --  138 139 137 138  K 3.7  --  4.4 4.1 3.5 3.4*  CL 106  --  106 110 111 108  CO2 23  --  22 22 19* 20*  GLUCOSE 99  --  134* 99 89 87  BUN 28*  --  31* 32* 24* 22*  CREATININE 1.89* 2.07* 2.43* 1.99* 1.93* 1.83*  CALCIUM 8.2*  --  7.5* 7.9* 7.7* 8.0*   GFR: Estimated Creatinine Clearance: 57.7 mL/min (A) (by C-G formula based on SCr of 1.83 mg/dL (H)). Liver Function Tests: Recent Labs  Lab 09/30/20 2127  AST 19  ALT 13  ALKPHOS 99  BILITOT 0.9  PROT 7.1  ALBUMIN 3.3*   No results for input(s): LIPASE, AMYLASE in the last 168 hours. No results for input(s): AMMONIA in the last 168 hours. Coagulation Profile: Recent Labs  Lab 10/04/20 0554  INR 1.1   Cardiac Enzymes: No results for input(s): CKTOTAL, CKMB, CKMBINDEX, TROPONINI in the last 168 hours. BNP (last  3 results) No results for input(s): PROBNP in the last 8760 hours. HbA1C: No results for input(s): HGBA1C in the last 72 hours. CBG: No results for input(s): GLUCAP in the last 168 hours. Lipid Profile: No results for input(s): CHOL, HDL, LDLCALC, TRIG, CHOLHDL, LDLDIRECT in the last 72 hours. Thyroid Function Tests: No results for input(s): TSH, T4TOTAL, FREET4, T3FREE, THYROIDAB in the last 72 hours. Anemia Panel: Recent Labs    10/03/20 1706  VITAMINB12 186  FOLATE 9.5  FERRITIN 346*  TIBC 267  IRON 12*  RETICCTPCT 2.5   Sepsis Labs: Recent Labs  Lab 09/30/20 2125 09/30/20 2127 10/01/20 0023  PROCALCITON 0.43  --   --   LATICACIDVEN  --  0.9 1.2    Recent Results (from the past 240 hour(s))  Urine Culture     Status: None   Collection Time: 09/30/20  9:25 PM   Specimen: Urine, Random  Result Value Ref Range Status   Specimen Description   Final    URINE, RANDOM Performed at [facility], [address] [place], [place] [address]    Special Requests   Final    NONE Performed at [facility], [address], [place], [place] [address]    Culture   Final    NO GROWTH Performed at [facility], [address] [place], [place] [address]    Report Status 10/02/2020 FINAL  Final  Resp Panel by RT-PCR (Flu A&B, Covid) Nasopharyngeal Swab     Status: None   Collection Time: 10/01/20 12:23 AM   Specimen: Nasopharyngeal Swab; Nasopharyngeal(NP) swabs in vial transport medium  Result Value Ref Range Status   SARS Coronavirus 2 by RT PCR NEGATIVE NEGATIVE Final    Comment: (NOTE) SARS-CoV-2 target nucleic acids are NOT DETECTED.  The SARS-CoV-2 RNA is generally detectable in upper respiratory specimens during the acute phase of infection. The lowest concentration of SARS-CoV-2 viral copies this assay can detect is 138 copies/mL. A negative result does not preclude SARS-Cov-2 infection and should not be used as the sole basis for treatment or other patient management decisions. A negative result may occur with  improper specimen collection/handling, submission of specimen other than nasopharyngeal swab, presence of viral mutation(s) within the areas targeted by this assay, and inadequate number of viral copies(<138 copies/mL). A negative result must be combined with clinical observations, patient history, and epidemiological information. The expected result is Negative.  Fact Sheet for Patients:  BloggerCourse.com  Fact Sheet for Healthcare Providers:  SeriousBroker.it  This test is no t yet approved or cleared by the [place] FDA and  has been authorized for detection and/or diagnosis of SARS-CoV-2 by FDA under an Emergency Use Authorization (EUA). This EUA will remain  in effect (meaning this test can be used) for the duration of the COVID-19 declaration under Section 564(b)(1) of the Act, 21 U.S.C.section 360bbb-3(b)(1), unless the authorization is terminated  or revoked sooner.       Influenza  A by PCR NEGATIVE NEGATIVE Final   Influenza B by PCR NEGATIVE NEGATIVE Final    Comment: (NOTE) The Xpert Xpress SARS-CoV-2/FLU/RSV plus assay is intended as an aid in the diagnosis of influenza from Nasopharyngeal swab specimens and should not be used as a sole basis for treatment. Nasal washings and aspirates are unacceptable for Xpert Xpress SARS-CoV-2/FLU/RSV testing.  Fact Sheet for Patients: BloggerCourse.com  Fact Sheet for Healthcare Providers: SeriousBroker.it  This test is not yet approved or cleared by the [place]  States FDA and has been authorized for detection and/or diagnosis of SARS-CoV-2 by FDA under an Emergency Use Authorization (EUA). This EUA will remain in effect (meaning this test can be used) for the duration of the COVID-19 declaration under Section 564(b)(1) of the Act, 21 U.S.C. section 360bbb-3(b)(1), unless the authorization is terminated or revoked.  Performed at [facility], [address] [place], [place] [address]   Blood culture (routine x 2)     Status: None (Preliminary result)   Collection Time: 10/01/20 12:36 AM   Specimen: BLOOD  Result Value Ref Range Status   Specimen Description BLOOD  RIGHT HAND  Final   Special Requests   Final    BOTTLES DRAWN AEROBIC AND ANAEROBIC Blood Culture adequate volume   Culture   Final    NO GROWTH 3 DAYS Performed at [facility], [address], [place], [place] [address]    Report Status PENDING  Incomplete  Blood culture (routine x 2)     Status: None (Preliminary result)   Collection Time: 10/01/20 12:57 AM   Specimen: BLOOD  Result Value Ref Range Status   Specimen Description BLOOD LEFT AC  Final   Special Requests   Final    BOTTLES DRAWN AEROBIC AND ANAEROBIC Blood Culture adequate volume   Culture   Final    NO GROWTH 3 DAYS Performed at [facility], [address], [place], [place] [address]    Report  Status PENDING  Incomplete  Gastrointestinal Panel by PCR , Stool     Status: None   Collection Time: 10/01/20 12:16 PM   Specimen: STOOL  Result Value Ref Range Status   Campylobacter species NOT DETECTED NOT DETECTED Final   Plesimonas shigelloides NOT DETECTED NOT DETECTED Final   Salmonella species NOT DETECTED NOT DETECTED Final   Yersinia enterocolitica NOT DETECTED NOT DETECTED Final   Vibrio species NOT DETECTED NOT DETECTED Final   Vibrio cholerae NOT DETECTED NOT DETECTED Final   Enteroaggregative E coli (EAEC) NOT DETECTED NOT DETECTED Final   Enteropathogenic E coli (EPEC) NOT DETECTED NOT DETECTED Final   Enterotoxigenic E coli (ETEC) NOT DETECTED NOT DETECTED Final   Shiga like toxin producing E coli (STEC) NOT DETECTED NOT DETECTED Final   Shigella/Enteroinvasive E coli (EIEC) NOT DETECTED NOT DETECTED Final   Cryptosporidium NOT DETECTED NOT DETECTED Final   Cyclospora cayetanensis NOT DETECTED NOT DETECTED Final   Entamoeba histolytica NOT DETECTED NOT DETECTED Final   Giardia lamblia NOT DETECTED NOT DETECTED Final   Adenovirus F40/41 NOT DETECTED NOT DETECTED Final   Astrovirus NOT DETECTED NOT DETECTED Final   Norovirus GI/GII NOT DETECTED NOT DETECTED Final   Rotavirus A NOT DETECTED NOT DETECTED Final   Sapovirus (I, II, IV, and V) NOT DETECTED NOT DETECTED Final    Comment: Performed at [facility], [address] [place], [place] [address]  C Difficile Quick Screen w PCR reflex     Status: None   Collection Time: 10/01/20 12:16 PM   Specimen: STOOL  Result Value Ref Range Status   C Diff antigen NEGATIVE NEGATIVE Final   C Diff toxin NEGATIVE NEGATIVE Final   C Diff interpretation No C. difficile detected.  Final    Comment: Performed at [facility], [address] [place], [place] [address]  Resp Panel by RT-PCR (Flu A&B, Covid) Nasopharyngeal Swab     Status: None   Collection Time: 10/03/20  6:40 PM   Specimen: Nasopharyngeal  Swab; Nasopharyngeal(NP) swabs in vial transport medium  Result Value Ref Range Status   SARS Coronavirus 2 by RT PCR NEGATIVE NEGATIVE Final    Comment: (NOTE) SARS-CoV-2 target nucleic acids are NOT DETECTED.  The SARS-CoV-2 RNA is generally detectable in upper respiratory specimens during the acute phase of infection. The lowest concentration of SARS-CoV-2 viral copies this assay can detect is 138 copies/mL. A negative result does not preclude SARS-Cov-2 infection and should not be used as the sole basis for treatment or other patient management decisions. A negative result may occur with  improper specimen collection/handling, submission of specimen other than nasopharyngeal swab, presence of viral mutation(s) within the areas targeted by this assay, and inadequate number of viral copies(<138 copies/mL). A negative result must be combined with clinical observations, patient history, and epidemiological information. The expected result is Negative.  Fact Sheet for Patients:  BloggerCourse.comhttps://www.fda.gov/media/152166/download  Fact Sheet for Healthcare Providers:  SeriousBroker.ithttps://www.fda.gov/media/152162/download  This test is no t yet approved or cleared by the [place] FDA and  has been authorized for detection and/or diagnosis of SARS-CoV-2 by FDA under an Emergency Use Authorization (EUA). This EUA will remain  in effect (meaning this test can be used) for the duration of the COVID-19 declaration under Section 564(b)(1) of the Act, 21 U.S.C.section 360bbb-3(b)(1), unless the authorization is terminated  or revoked sooner.       Influenza A by PCR NEGATIVE NEGATIVE Final   Influenza B by PCR NEGATIVE NEGATIVE Final    Comment: (NOTE) The Xpert Xpress SARS-CoV-2/FLU/RSV plus assay is intended as an aid in the diagnosis of influenza from Nasopharyngeal swab specimens and should not be used as a sole basis for treatment. Nasal washings and aspirates are unacceptable for Xpert Xpress  SARS-CoV-2/FLU/RSV testing.  Fact Sheet for Patients: BloggerCourse.comhttps://www.fda.gov/media/152166/download  Fact Sheet for Healthcare Providers: SeriousBroker.ithttps://www.fda.gov/media/152162/download  This test is not yet approved or cleared by the [place] FDA and has been authorized for detection and/or diagnosis of SARS-CoV-2 by FDA under an Emergency Use Authorization (EUA). This EUA will remain in effect (meaning this test can be used) for the duration of the COVID-19 declaration under Section 564(b)(1) of the Act, 21 U.S.C. section 360bbb-3(b)(1), unless the authorization is terminated or revoked.  Performed at [facility], [address] [place], [place] [address]      Radiology Studies: US BIOPSY (KIDNEY)  Result Date: 10/04/2020 INDICATION: 40 year old male with a history hematuria and proteinuria EXAM: IMAGE GUIDED MEDICAL RENAL BIOPSY MEDICATIONS: None. ANESTHESIA/SEDATION: Moderate (conscious) sedation was employed during this procedure. A total of Versed 2.0 mg and Fentanyl 100 mcg was administered intravenously. Moderate Sedation Time: 10 minutes. The patient's level of consciousness and vital signs were monitored continuously by radiology nursing throughout the procedure under my direct supervision. FLUOROSCOPY TIME:  Ultrasound COMPLICATIONS: NONE PROCEDURE: Informed written consent was obtained from the patient after a thorough discussion of the procedural risks, benefits and alternatives. All questions were addressed. Maximal Sterile Barrier Technique was utilized including caps, mask, sterile gowns, sterile gloves, sterile drape, hand hygiene and skin antiseptic. A timeout was performed prior to the initiation of the procedure. Patient was positioned prone position on the gantry table. Images were stored sent to PACs. Once the patient is prepped and draped in the usual sterile fashion, the skin and subcutaneous tissues overlying the left kidney were generously infiltrated 1%  lidocaine for local anesthesia. Using ultrasound guidance, a 17 gauge guide needle was advanced into the lower cortex of the left kidney. Once  we confirmed location of the needle tip, 4 separate 18 gauge core biopsy were achieved. Two Gel-Foam pledgets were infused with a small amount of saline. The needle was removed. Final images were stored. The patient tolerated the procedure well and remained hemodynamically stable throughout. No complications were encountered and no significant blood loss encountered. IMPRESSION: Status post image guided medical renal biopsy. Signed, Yvone Neu. Reyne Dumas, RPVI [facility] Electronically Signed   By: Gilmer Mor D.O.   On: 10/04/2020 13:33   CT TEMPORAL BONES WO CONTRAST  Result Date: 10/03/2020 CLINICAL DATA:  Hearing loss status post cochlear implant. Fever of unknown origin. Headache. EXAM: CT TEMPORAL BONES WITHOUT CONTRAST TECHNIQUE: Axial and coronal plane CT imaging of the petrous temporal bones was performed with thin-collimation image reconstruction. No intravenous contrast was administered. Multiplanar CT image reconstructions were also generated. COMPARISON:  None. FINDINGS: RIGHT: The external auditory canal is unremarkable. The ossicles appear intact. The tympanic cavity is clear. The internal auditory canal, and semicircular canals are unremarkable. The cochlea is abnormal with a deficient modiolus and absent septation between the middle and apical turns. The vestibular aqueduct is enlarged. The vestibule is also mildly enlarged. The mastoid air cells are clear. LEFT: Sequelae of canal wall up mastoidectomy are identified. A cochlear implant is in place. There is streak artifact about the external portion of the implant without a gross fluid collection identified. As on the contralateral side, the vestibule and vestibular aqueduct are enlarged. The cochlea is also likely abnormal although the presence  of the implant limits assessment. The tympanic cavity and residual mastoid air cells are clear. The ossicles appear intact. The internal auditory canal and semicircular canals are unremarkable. There is mild scattered mucosal thickening in the paranasal sinuses there is a 1 cm bony defect in the posterior wall of the left maxillary sinus without inflammatory changes in the retro antral fat. The visualized portions of the orbits and brain are unremarkable. IMPRESSION: 1. Left cochlear implant in place. No fluid collection or other acute abnormality identified. 2. 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 Electronically Signed   By: Sebastian Ache M.D.   On: 10/03/2020 18:37    Scheduled Meds: . [START ON 10/05/2020] ferrous sulfate  325 mg Oral Q breakfast  . hydrALAZINE  50 mg Oral Q8H  . irbesartan  75 mg Oral Daily  . melatonin  5 mg Oral QHS   Continuous Infusions: . cefTRIAXone (ROCEPHIN)  IV Stopped (10/03/20 2358)     LOS: 3 days   Time spent: 35 minutes. More than 50% of the time was spent in counseling/coordination of care  Arnetha Courser, MD [facility]  If 7PM-7AM, please contact night-coverage [email or web address]  10/04/2020, 7:15 PM   This record has been created using Conservation officer, historic buildings. Errors have been sought and corrected,but may not always be located. Such creation errors do not reflect on the standard of care.

## 2020-10-04 NOTE — Consult Note (Signed)
Chief Complaint: Hematuria  Referring Physician(s): Dr. Nelson Chimes Nephrology: Dr. Thedore Mins  Supervising Physician: Gilmer Mor  Patient Status: [facility] - In-pt  History of Present Illness: Jeremiah [name] is a 40 y.o. male presenting to [facility] for image guided renal biopsy.   He was admitted 4/3 via the ER for fever, chills, congestion and headache Nephrology was consulted for acute renal insufficiency.  He has reported remote history of hematuria.  U/a from 4/3 shows proteinuria >300 mg, + Hgb  CT 4/4 shows no hydro, nephrolithiasis, or any sign of tumor (non-contrast) that would account for blood in the urine.   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      Past Surgical History:  Procedure Laterality Date  . COCHLEAR IMPLANT     2000 and 2007  . SPLENECTOMY  1988  . WISDOM TOOTH EXTRACTION  03/2014    Allergies: Aspirin  Medications: Prior to Admission medications   Medication Sig Start Date End Date Taking? Authorizing Provider  ibuprofen (ADVIL) 200 MG tablet Take 200 mg by mouth every 6 (six) hours as needed.   Yes [provider]  tobramycin-dexamethasone Wallene Dales) ophthalmic solution Place 2 drops into both eyes 2 (two) times daily. For 4 days 09/29/20  Yes [provider]     Family History  Problem Relation Age of Onset  . Hyperlipidemia Father     Social History   Socioeconomic History  . Marital status: Single    Spouse name: Not on file  . Number of children: Not on file  . Years of education: Not on file  . Highest education level: Not on file  Occupational History  . Not on file  Tobacco Use  . Smoking status: Current Some Day Smoker  . Smokeless tobacco: Never Used  Substance and Sexual Activity  . Alcohol use: No  . Drug use: No  . Sexual activity: Not on file  Other Topics Concern  . Not on file  Social History Narrative  . Not on file   Social Determinants of Health   Financial Resource Strain: Not on  file  Food Insecurity: Not on file  Transportation Needs: Not on file  Physical Activity: Not on file  Stress: Not on file  Social Connections: Not on file      Review of Systems: A 12 point ROS discussed and pertinent positives are indicated in the HPI above.  All other systems are negative.  Review of Systems  Vital Signs: BP (!) 141/89 (BP Location: Left Arm)   Pulse 86   Temp 98.6 F (37 C) (Oral)   Resp 14   Ht 5\' 11"  (1.803 m)   Wt 87.6 kg   SpO2 96%   BMI 26.94 kg/m   Physical Exam General: 40 yo male in hospital bed appearing stated age.  Well-developed, well-nourished.  No distress.  SBP is 150-170 on meeting him, cuff pressure.  HEENT: Atraumatic, normocephalic. Left cochlear implant.   Conjugate gaze, extra-ocular motor intact.  .  Neck: Symmetric with no goiter enlargement.  Chest/Lungs:  Symmetric chest with inspiration/expiration.  No labored breathing.    Heart:   No JVD appreciated.  Abdomen:  Soft, NT/ND, with + bowel sounds.   Genito-urinary: Deferred Neurologic: Alert & Oriented to person, place, and time.   Normal affect and insight.  Appropriate questions.  Moving all 4 extremities with gross sensory intact.     Imaging: DG Chest 2 View  Result Date: 09/30/2020 CLINICAL DATA:  Congestion EXAM: CHEST - 2 VIEW COMPARISON:  Nov 20, 2016 FINDINGS: The heart size and mediastinal contours are within normal limits. Both lungs are clear. The visualized skeletal structures are unremarkable. IMPRESSION: No active cardiopulmonary disease. Electronically Signed   By: Katherine Mantle M.D.   On: 09/30/2020 22:12   CT Renal Stone Study  Result Date: 10/01/2020 CLINICAL DATA:  Hematuria EXAM: CT ABDOMEN AND PELVIS WITHOUT CONTRAST TECHNIQUE: Multidetector CT imaging of the abdomen and pelvis was performed following the standard protocol without IV contrast. COMPARISON:  CT pelvis 08/07/2014 FINDINGS: Lower chest: The lung bases are clear. Hepatobiliary: No focal liver  abnormality is seen. No gallstones, gallbladder wall thickening, or biliary dilatation. Pancreas: Unremarkable. No pancreatic ductal dilatation or surrounding inflammatory changes. 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 Adrenals/Urinary Tract: Adrenal glands are unremarkable. Kidneys are normal, without renal calculi, focal lesion, or hydronephrosis. Bladder is unremarkable. Stomach/Bowel: Stomach is within normal limits. Appendix appears normal. No evidence of bowel wall thickening, distention, or inflammatory changes. Vascular/Lymphatic: No significant vascular findings are present. No enlarged abdominal or pelvic lymph nodes. Reproductive: Uterus and bilateral adnexa are unremarkable. Other: No abdominal wall hernia or abnormality. No abdominopelvic ascites. Musculoskeletal: No acute or significant osseous findings. IMPRESSION: No acute process demonstrated in the abdomen or pelvis. No renal or ureteral stone or obstruction. Absence of the spleen. Electronically Signed   By: Burman Nieves M.D.   On: 10/01/2020 00:59   CT TEMPORAL BONES WO CONTRAST  Result Date: 10/03/2020 CLINICAL DATA:  Hearing loss status post cochlear implant. Fever of unknown origin. Headache. EXAM: CT TEMPORAL BONES WITHOUT CONTRAST TECHNIQUE: Axial and coronal plane CT imaging of the petrous temporal bones was performed with thin-collimation image reconstruction. No intravenous contrast was administered. Multiplanar CT image reconstructions were also generated. COMPARISON:  None. FINDINGS: RIGHT: The external auditory canal is unremarkable. The ossicles appear intact. The tympanic cavity is clear. The internal auditory canal, and semicircular canals are unremarkable. The cochlea is abnormal with a deficient modiolus and absent septation between the middle and apical turns. The vestibular aqueduct is enlarged. The vestibule is also mildly enlarged. The mastoid air cells are clear. LEFT: Sequelae of canal wall  up mastoidectomy are identified. A cochlear implant is in place. There is streak artifact about the external portion of the implant without a gross fluid collection identified. As on the contralateral side, the vestibule and vestibular aqueduct are enlarged. The cochlea is also likely abnormal although the presence of the implant limits assessment. The tympanic cavity and residual mastoid air cells are clear. The ossicles appear intact. The internal auditory canal and semicircular canals are unremarkable. There is mild scattered mucosal thickening in the paranasal sinuses there is a 1 cm bony defect in the posterior wall of the left maxillary sinus without inflammatory changes in the retro antral fat. The visualized portions of the orbits and brain are unremarkable. IMPRESSION: 1. Left cochlear implant in place. No fluid collection or other acute abnormality identified. 2. 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 Electronically Signed   By: Sebastian Ache M.D.   On: 10/03/2020 18:37    Labs:  CBC: Recent Labs    10/01/20 0507 10/02/20 0537 10/03/20 0502 10/04/20 0554  WBC 31.8* 28.3* 23.8* 22.7*  HGB 11.0* 9.7* 9.5* 9.7*  HCT 31.5* 28.5* 27.4* 27.3*  PLT 224 194 187 191    COAGS: Recent Labs    10/04/20  0554  INR 1.1  APTT 47*    BMP: Recent Labs    10/01/20 1204 10/02/20 0537 10/03/20 0502 10/04/20 0554  NA 138 139 137 138  K 4.4 4.1 3.5 3.4*  CL 106 110 111 108  CO2 22 22 19* 20*  GLUCOSE 134* 99 89 87  BUN 31* 32* 24* 22*  CALCIUM 7.5* 7.9* 7.7* 8.0*  CREATININE 2.43* 1.99* 1.93* 1.83*  GFRNONAA 34* 43* 45* 48*    LIVER FUNCTION TESTS: Recent Labs    09/30/20 2127  BILITOT 0.9  AST 19  ALT 13  ALKPHOS 99  PROT 7.1  ALBUMIN 3.3*    TUMOR MARKERS: No results for input(s): AFPTM, CEA, CA199, CHROMGRNA in the last 8760 hours.  Assessment and  Plan:  40 yo male with recurrent hematuria and proteinuria.  Referred for medical renal biopsy.   Plan for biopsy.   Risks and benefits of image guided renal biopsy was discussed with the patient   including, but not limited to bleeding, infection, damage to adjacent structures or low yield requiring additional tests.  All of the questions were answered and there is agreement to proceed.  Consent signed and in chart.   Thank you for this interesting consult.  I greatly enjoyed meeting Jeremiah [name] and look forward to participating in their care.  A copy of this report was sent to the requesting provider on this date.  Electronically Signed: Gilmer Mor, DO 10/04/2020, 10:41 AM   I spent a total of 40 Minutes    in face to face in clinical consultation, greater than 50% of which was counseling/coordinating care for image guided medical renal biopsy

## 2020-10-04 NOTE — Procedures (Signed)
Interventional Radiology Procedure Note  Procedure: US guided biopsy of left kidney, medical renal bx Complications: None EBL: None Recommendations: - Bedrest 3 hours, then ambulate per primary.   - Routine wound care - Follow up pathology - Advance diet per primary order  Signed,  Gilmer Mor, DO

## 2020-10-04 NOTE — Plan of Care (Signed)
Pt noted to have continued low grade temp with c/o of mild HA 3/10 this am. Decreased room temperature and Tylenol administered. Pt urine varying from pink colored to looking tea colored and amber. Adequate output noted. NPO since MN. Vitals monitored closely. Will continue to monitor.  Problem: Clinical Measurements: Goal: Will remain free from infection Outcome: Progressing Goal: Diagnostic test results will improve Outcome: Progressing

## 2020-10-04 NOTE — Consult Note (Signed)
Infectious Disease     Reason for Consult:Sepsis   Referring Physician: Dr Reesa Chew Date of Admission:  09/30/2020   Active Problems:   Sepsis ([place])   History of splenectomy   Acute renal failure ([place])   Elevated BP without diagnosis of hypertension   URI (upper respiratory infection)   History of cochlear implant   HPI: Jeremiah [name] is a 40 y.o. male Pleasant gentleman with a history of neonatal splenectomy for ITP as well as cochlear implant for hearing loss who was admitted with symptoms of fever and upper respiratory tract symptoms.  These have been ongoing for several days with sinus congestion.  He been seen by ENT and treated with tobramycin and dexamethasone eyedrops.  He did have one episode of loose stools as well.  On admission his white count was elevated at 17,000 but then increased to 31,000 with predominant neutrophils.  His creatinine was elevated at 2.07.  LFTs were normal.  He was also found to have anemia with a hemoglobin of 11.0 that is since decreased to 9.7.  He had marked proteinuria and hematuria noted as well.  He had chest x-ray done and a CT of his abdomen without contrast which were negative.  He had a CT of his temporal bones done and was seen by ENT and this was normal.  He had a negative Covid test negative respiratory panel PCR for flu.  Negative HIV and hepatitis serologies.  He had negative C. difficile and GI panel.  Blood cultures and urine cultures are negative. He was started on IV antibiotics with vancomycin and ceftriaxone.  He underwent a renal biopsy today and results are pending.  History reviewed. No pertinent past medical history. Past Surgical History:  Procedure Laterality Date  . COCHLEAR IMPLANT     2000 and 2007  . SPLENECTOMY  1988  . WISDOM TOOTH EXTRACTION  03/2014   Social History   Tobacco Use  . Smoking status: Current Some Day Smoker  . Smokeless tobacco: Never Used  Substance Use Topics  . Alcohol use: No  . Drug use: No    Family History  Problem Relation Age of Onset  . Hyperlipidemia Father     Allergies:  Allergies  Allergen Reactions  . Aspirin     Current antibiotics: Antibiotics Given (last 72 hours)    Date/Time Action Medication Dose Rate   10/01/20 2315 New Bag/Given   cefTRIAXone (ROCEPHIN) 1 g in sodium chloride 0.9 % 100 mL IVPB 1 g 200 mL/hr   10/02/20 0400 New Bag/Given   vancomycin (VANCOREADY) IVPB 1250 mg/250 mL 1,250 mg 166.7 mL/hr   10/02/20 2309 New Bag/Given  [cluster care.]   cefTRIAXone (ROCEPHIN) 1 g in sodium chloride 0.9 % 100 mL IVPB 1 g 200 mL/hr   10/03/20 0403 New Bag/Given   vancomycin (VANCOREADY) IVPB 1250 mg/250 mL 1,250 mg 166.7 mL/hr   10/03/20 2328 New Bag/Given   cefTRIAXone (ROCEPHIN) 1 g in sodium chloride 0.9 % 100 mL IVPB 1 g 200 mL/hr      MEDICATIONS: . hydrALAZINE  50 mg Oral Q8H  . irbesartan  75 mg Oral Daily  . melatonin  5 mg Oral QHS    Review of Systems - 11 systems reviewed and negative per HPI   OBJECTIVE: Temp:  [97.7 F (36.5 C)-100.2 F (37.9 C)] 98.5 F (36.9 C) (04/07 1129) Pulse Rate:  [86-104] 104 (04/07 1129) Resp:  [12-20] 18 (04/07 1129) BP: (141-167)/(88-98) 142/89 (04/07 1129) SpO2:  [96 %-  100 %] 97 % (04/07 1129) Weight:  [87.6 kg] 87.6 kg (04/07 0500) Physical Exam  Constitutional: He is oriented to person, place, and time. He appears well-developed and well-nourished. No distress.  HENT: anicteric Mouth/Throat: Oropharynx is clear and moist. No oropharyngeal exudate.  Cardiovascular: Normal rate, regular rhythm and normal heart sounds. Exam reveals no gallop and no friction rub.  No murmur heard.  Pulmonary/Chest: Effort normal and breath sounds normal. No respiratory distress. He has no wheezes.  Abdominal: Soft. Bowel sounds are normal. He exhibits no distension. There is no tenderness.  Lymphadenopathy:  He has no cervical adenopathy.  Neurological: He is alert and oriented to person, place, and time.   Skin: Skin is warm and dry. No rash noted. No erythema.  Psychiatric: He has a normal mood and affect. His behavior is normal.     LABS: Results for orders placed or performed during the hospital encounter of 09/30/20 (from the past 48 hour(s))  Basic metabolic panel     Status: Abnormal   Collection Time: 10/03/20  5:02 AM  Result Value Ref Range   Sodium 137 135 - 145 mmol/L   Potassium 3.5 3.5 - 5.1 mmol/L   Chloride 111 98 - 111 mmol/L   CO2 19 (L) 22 - 32 mmol/L   Glucose, Bld 89 70 - 99 mg/dL    Comment: Glucose reference range applies only to samples taken after fasting for at least 8 hours.   BUN 24 (H) 6 - 20 mg/dL   Creatinine, Ser 1.93 (H) 0.61 - 1.24 mg/dL   Calcium 7.7 (L) 8.9 - 10.3 mg/dL   GFR, Estimated 45 (L) >60 mL/min    Comment: (NOTE) Calculated using the CKD-EPI Creatinine Equation (2021)    Anion gap 7 5 - 15    Comment: Performed at [facility], [address] [place], [place] [address]  CBC     Status: Abnormal   Collection Time: 10/03/20  5:02 AM  Result Value Ref Range   WBC 23.8 (H) 4.0 - 10.5 K/uL   RBC 3.29 (L) 4.22 - 5.81 MIL/uL   Hemoglobin 9.5 (L) 13.0 - 17.0 g/dL   HCT 27.4 (L) 39.0 - 52.0 %   MCV 83.3 80.0 - 100.0 fL   MCH 28.9 26.0 - 34.0 pg   MCHC 34.7 30.0 - 36.0 g/dL   RDW 16.4 (H) 11.5 - 15.5 %   Platelets 187 150 - 400 K/uL   nRBC 0.1 0.0 - 0.2 %    Comment: Performed at [facility], [address] [place], [place] [address]  C3 complement     Status: None   Collection Time: 10/03/20  1:56 PM  Result Value Ref Range   C3 Complement 149 82 - 167 mg/dL    Comment: (NOTE) Performed At: [facility] [place], [place] [id number] Rush Farmer MD [id number]   C4 complement     Status: None   Collection Time: 10/03/20  1:56 PM  Result Value Ref Range   Complement C4, Body Fluid 30 12 - 38 mg/dL    Comment: (NOTE) Performed At: [facility] [place], [place]  [id number] Rush Farmer MD [id number]   Glomerular basement membrane antibodies     Status: None   Collection Time: 10/03/20  1:56 PM  Result Value Ref Range   GBM Ab 3 0 - 20 units    Comment: (NOTE)  Negative                   0 - 20                   Weak Positive             21 - 30                   Moderate to Strong Positive   >30 Performed At: [facility] [address] [place], [place] [id number] Rush Farmer MD [id number]   Antistreptolysin O titer     Status: None   Collection Time: 10/03/20  1:56 PM  Result Value Ref Range   ASO <20.0 0.0 - 200.0 IU/mL    Comment: (NOTE) Performed At: [facility] [place], [place] [id number] Rush Farmer MD [id number]   Hepatitis B surface antigen     Status: None   Collection Time: 10/03/20  1:56 PM  Result Value Ref Range   Hepatitis B Surface Ag NON REACTIVE NON REACTIVE    Comment: Performed at [facility], [address], [place], [place] [address]  Hepatitis B surface antibody,qualitative     Status: None   Collection Time: 10/03/20  1:56 PM  Result Value Ref Range   Hep B S Ab NON REACTIVE NON REACTIVE    Comment: (NOTE) Inconsistent with immunity, less than 10 mIU/mL.  Performed at [facility], [place] [address] [place], [place] [address]   Hepatitis B core antibody, total     Status: None   Collection Time: 10/03/20  1:56 PM  Result Value Ref Range   Hep B Core Total Ab NON REACTIVE NON REACTIVE    Comment: Performed at [facility] [address], [place], [place] [address]  HIV Antibody (routine testing w rflx)     Status: None   Collection Time: 10/03/20  1:56 PM  Result Value Ref Range   HIV Screen 4th Generation wRfx Non Reactive Non Reactive    Comment: Performed at [facility], [place] [address], [place], [place] [address]  Hepatitis C antibody     Status: None   Collection Time: 10/03/20  1:56 PM  Result Value Ref  Range   HCV Ab NON REACTIVE NON REACTIVE    Comment: (NOTE) Nonreactive HCV antibody screen is consistent with no HCV infections,  unless recent infection is suspected or other evidence exists to indicate HCV infection.  Performed at [facility], [place] [address] [place], [place] [address]   Protein / creatinine ratio, urine     Status: Abnormal   Collection Time: 10/03/20  2:15 PM  Result Value Ref Range   Creatinine, Urine 25 mg/dL   Total Protein, Urine 139 mg/dL    Comment: NO NORMAL RANGE ESTABLISHED FOR THIS TEST   Protein Creatinine Ratio 5.56 (H) 0.00 - 0.15 mg/mg[Cre]    Comment: Performed at [facility], [address] [place], [place] [address]  Urinalysis, Routine w reflex microscopic     Status: Abnormal   Collection Time: 10/03/20  2:15 PM  Result Value Ref Range   Color, Urine STRAW (A) YELLOW   APPearance CLEAR (A) CLEAR   Specific Gravity, Urine 1.003 (L) 1.005 - 1.030   pH 5.0 5.0 - 8.0   Glucose, UA NEGATIVE NEGATIVE mg/dL   Hgb urine dipstick LARGE (A) NEGATIVE   Bilirubin Urine NEGATIVE NEGATIVE   Ketones,  ur NEGATIVE NEGATIVE mg/dL   Protein, ur 100 (A) NEGATIVE mg/dL   Nitrite NEGATIVE NEGATIVE   Leukocytes,Ua TRACE (A) NEGATIVE   RBC / HPF 11-20 0 - 5 RBC/hpf   WBC, UA 6-10 0 - 5 WBC/hpf   Bacteria, UA NONE SEEN NONE SEEN   Squamous Epithelial / LPF 0-5 0 - 5    Comment: Performed at [facility], [address] [place], [place] [address]  Vitamin B12     Status: None   Collection Time: 10/03/20  5:06 PM  Result Value Ref Range   Vitamin B-12 186 180 - 914 pg/mL    Comment: (NOTE) This assay is not validated for testing neonatal or myeloproliferative syndrome specimens for Vitamin B12 levels. Performed at [facility], [place] [address], [place], [place] [address]   Folate     Status: None   Collection Time: 10/03/20  5:06 PM  Result Value Ref Range   Folate 9.5 >5.9 ng/mL    Comment: Performed at [facility], [address] [place], [place] [address]  Iron and TIBC     Status: Abnormal   Collection Time: 10/03/20  5:06 PM  Result Value Ref Range   Iron 12 (L) 45 - 182 ug/dL   TIBC 267 250 - 450 ug/dL   Saturation Ratios 5 (L) 17.9 - 39.5 %   UIBC 255 ug/dL    Comment: Performed at [facility], [address] [place], [place] [address]  Ferritin     Status: Abnormal   Collection Time: 10/03/20  5:06 PM  Result Value Ref Range   Ferritin 346 (H) 24 - 336 ng/mL    Comment: Performed at [facility], [address] [place], [place] [address]  Reticulocytes     Status: Abnormal   Collection Time: 10/03/20  5:06 PM  Result Value Ref Range   Retic Ct Pct 2.5 0.4 - 3.1 %   RBC. 3.58 (L) 4.22 - 5.81 MIL/uL   Retic Count, Absolute 89.1 19.0 - 186.0 K/uL   Immature Retic Fract 8.6 2.3 - 15.9 %    Comment: Performed at [facility], [address] [place], [place] [address]  Type and screen [place]     Status: None   Collection Time: 10/03/20  5:53 PM  Result Value Ref Range   ABO/RH(D) O POS    Antibody Screen NEG    Sample Expiration      10/06/2020,2359 Performed at [facility], [address], [place], [place] [address]   Resp Panel by RT-PCR (Flu A&B, Covid) Nasopharyngeal Swab     Status: None   Collection Time: 10/03/20  6:40 PM   Specimen: Nasopharyngeal Swab; Nasopharyngeal(NP) swabs in vial transport medium  Result Value Ref Range   SARS Coronavirus 2 by RT PCR NEGATIVE NEGATIVE    Comment: (NOTE) SARS-CoV-2 target nucleic acids are NOT DETECTED.  The SARS-CoV-2 RNA is generally detectable in upper respiratory specimens during the acute phase of infection. The lowest concentration of SARS-CoV-2 viral copies this assay can detect is 138 copies/mL. A negative result does not preclude SARS-Cov-2 infection and should not be used as the sole basis for treatment or other patient management decisions.  A negative result may occur with  improper specimen collection/handling, submission of specimen other than nasopharyngeal swab, presence of viral mutation(s) within the areas targeted by this assay, and inadequate number of viral copies(<138 copies/mL). A negative result must be combined  with clinical observations, patient history, and epidemiological information. The expected result is Negative.  Fact Sheet for Patients:  EntrepreneurPulse.com.au  Fact Sheet for Healthcare Providers:  IncredibleEmployment.be  This test is no t yet approved or cleared by the [place] FDA and  has been authorized for detection and/or diagnosis of SARS-CoV-2 by FDA under an Emergency Use Authorization (EUA). This EUA will remain  in effect (meaning this test can be used) for the duration of the COVID-19 declaration under Section 564(b)(1) of the Act, 21 U.S.C.section 360bbb-3(b)(1), unless the authorization is terminated  or revoked sooner.       Influenza A by PCR NEGATIVE NEGATIVE   Influenza B by PCR NEGATIVE NEGATIVE    Comment: (NOTE) The Xpert Xpress SARS-CoV-2/FLU/RSV plus assay is intended as an aid in the diagnosis of influenza from Nasopharyngeal swab specimens and should not be used as a sole basis for treatment. Nasal washings and aspirates are unacceptable for Xpert Xpress SARS-CoV-2/FLU/RSV testing.  Fact Sheet for Patients: EntrepreneurPulse.com.au  Fact Sheet for Healthcare Providers: IncredibleEmployment.be  This test is not yet approved or cleared by the [place] FDA and has been authorized for detection and/or diagnosis of SARS-CoV-2 by FDA under an Emergency Use Authorization (EUA). This EUA will remain in effect (meaning this test can be used) for the duration of the COVID-19 declaration under Section 564(b)(1) of the Act, 21 U.S.C. section 360bbb-3(b)(1), unless the authorization is  terminated or revoked.  Performed at [facility], [address] [place], [place] [address]   Basic metabolic panel     Status: Abnormal   Collection Time: 10/04/20  5:54 AM  Result Value Ref Range   Sodium 138 135 - 145 mmol/L   Potassium 3.4 (L) 3.5 - 5.1 mmol/L   Chloride 108 98 - 111 mmol/L   CO2 20 (L) 22 - 32 mmol/L   Glucose, Bld 87 70 - 99 mg/dL    Comment: Glucose reference range applies only to samples taken after fasting for at least 8 hours.   BUN 22 (H) 6 - 20 mg/dL   Creatinine, Ser 1.83 (H) 0.61 - 1.24 mg/dL   Calcium 8.0 (L) 8.9 - 10.3 mg/dL   GFR, Estimated 48 (L) >60 mL/min    Comment: (NOTE) Calculated using the CKD-EPI Creatinine Equation (2021)    Anion gap 10 5 - 15    Comment: Performed at [facility], [address] [place], [place] [address]  CBC     Status: Abnormal   Collection Time: 10/04/20  5:54 AM  Result Value Ref Range   WBC 22.7 (H) 4.0 - 10.5 K/uL   RBC 3.34 (L) 4.22 - 5.81 MIL/uL   Hemoglobin 9.7 (L) 13.0 - 17.0 g/dL   HCT 27.3 (L) 39.0 - 52.0 %   MCV 81.7 80.0 - 100.0 fL   MCH 29.0 26.0 - 34.0 pg   MCHC 35.5 30.0 - 36.0 g/dL   RDW 16.0 (H) 11.5 - 15.5 %   Platelets 191 150 - 400 K/uL   nRBC 0.0 0.0 - 0.2 %    Comment: Performed at [facility], [address] [place], [place] [address]  Protime-INR     Status: None   Collection Time: 10/04/20  5:54 AM  Result Value Ref Range   Prothrombin Time 14.1 11.4 - 15.2 seconds   INR 1.1 0.8 - 1.2    Comment: (NOTE) INR goal varies based on device and disease states. Performed at [facility], [phone] [place], [place] [address]   APTT     Status: Abnormal   Collection Time: 10/04/20  5:54 AM  Result Value Ref Range   aPTT 47 (H) 24 - 36 seconds    Comment:        IF BASELINE aPTT IS ELEVATED, SUGGEST PATIENT RISK ASSESSMENT BE USED TO DETERMINE APPROPRIATE ANTICOAGULANT THERAPY. Performed at [facility], [address] [place], [place] [address]    No components found for: ESR, C REACTIVE PROTEIN MICRO: Recent Results (from the past 720 hour(s))  Urine Culture     Status: None   Collection Time: 09/30/20  9:25 PM   Specimen: Urine, Random  Result Value Ref Range Status   Specimen Description   Final    URINE, RANDOM Performed at [facility], [address] [place], [place] [address]    Special Requests   Final    NONE Performed at [facility], [address], [place], [place] [address]    Culture   Final    NO GROWTH Performed at [facility], [place] [address], [place], [place] [address]    Report Status 10/02/2020 FINAL  Final  Resp Panel by RT-PCR (Flu A&B, Covid) Nasopharyngeal Swab     Status: None   Collection Time: 10/01/20 12:23 AM   Specimen: Nasopharyngeal Swab; Nasopharyngeal(NP) swabs in vial transport medium  Result Value Ref Range Status   SARS Coronavirus 2 by RT PCR NEGATIVE NEGATIVE Final    Comment: (NOTE) SARS-CoV-2 target nucleic acids are NOT DETECTED.  The SARS-CoV-2 RNA is generally detectable in upper respiratory specimens during the acute phase of infection. The lowest concentration of SARS-CoV-2 viral copies this assay can detect is 138 copies/mL. A negative result does not preclude SARS-Cov-2 infection and should not be used as the sole basis for treatment or other patient management decisions. A negative result may occur with  improper specimen collection/handling, submission of specimen other than nasopharyngeal swab, presence of viral mutation(s) within the areas targeted by this assay, and inadequate number of viral copies(<138 copies/mL). A negative result must be combined with clinical observations, patient history, and epidemiological information. The expected result is Negative.  Fact Sheet for Patients:  EntrepreneurPulse.com.au  Fact Sheet for Healthcare Providers:   IncredibleEmployment.be  This test is no t yet approved or cleared by the [place] FDA and  has been authorized for detection and/or diagnosis of SARS-CoV-2 by FDA under an Emergency Use Authorization (EUA). This EUA will remain  in effect (meaning this test can be used) for the duration of the COVID-19 declaration under Section 564(b)(1) of the Act, 21 U.S.C.section 360bbb-3(b)(1), unless the authorization is terminated  or revoked sooner.       Influenza A by PCR NEGATIVE NEGATIVE Final   Influenza B by PCR NEGATIVE NEGATIVE Final    Comment: (NOTE) The Xpert Xpress SARS-CoV-2/FLU/RSV plus assay is intended as an aid in the diagnosis of influenza from Nasopharyngeal swab specimens and should not be used as a sole basis for treatment. Nasal washings and aspirates are unacceptable for Xpert Xpress SARS-CoV-2/FLU/RSV testing.  Fact Sheet for Patients: EntrepreneurPulse.com.au  Fact Sheet for Healthcare Providers: IncredibleEmployment.be  This test is not yet approved or cleared by the [place] FDA and has been authorized for detection and/or diagnosis of SARS-CoV-2 by FDA under an Emergency Use Authorization (EUA). This EUA will remain in effect (meaning this test can be used) for the duration of the COVID-19 declaration under  Section 564(b)(1) of the Act, 21 U.S.C. section 360bbb-3(b)(1), unless the authorization is terminated or revoked.  Performed at [facility], [address] [place], [place] [address]   Blood culture (routine x 2)     Status: None (Preliminary result)   Collection Time: 10/01/20 12:36 AM   Specimen: BLOOD  Result Value Ref Range Status   Specimen Description BLOOD  RIGHT HAND  Final   Special Requests   Final    BOTTLES DRAWN AEROBIC AND ANAEROBIC Blood Culture adequate volume   Culture   Final    NO GROWTH 3 DAYS Performed at [facility], [address]  [place], [place] [address]    Report Status PENDING  Incomplete  Blood culture (routine x 2)     Status: None (Preliminary result)   Collection Time: 10/01/20 12:57 AM   Specimen: BLOOD  Result Value Ref Range Status   Specimen Description BLOOD LEFT AC  Final   Special Requests   Final    BOTTLES DRAWN AEROBIC AND ANAEROBIC Blood Culture adequate volume   Culture   Final    NO GROWTH 3 DAYS Performed at [facility], [address] [place], [place] [address]    Report Status PENDING  Incomplete  Gastrointestinal Panel by PCR , Stool     Status: None   Collection Time: 10/01/20 12:16 PM   Specimen: STOOL  Result Value Ref Range Status   Campylobacter species NOT DETECTED NOT DETECTED Final   Plesimonas shigelloides NOT DETECTED NOT DETECTED Final   Salmonella species NOT DETECTED NOT DETECTED Final   Yersinia enterocolitica NOT DETECTED NOT DETECTED Final   Vibrio species NOT DETECTED NOT DETECTED Final   Vibrio cholerae NOT DETECTED NOT DETECTED Final   Enteroaggregative E coli (EAEC) NOT DETECTED NOT DETECTED Final   Enteropathogenic E coli (EPEC) NOT DETECTED NOT DETECTED Final   Enterotoxigenic E coli (ETEC) NOT DETECTED NOT DETECTED Final   Shiga like toxin producing E coli (STEC) NOT DETECTED NOT DETECTED Final   Shigella/Enteroinvasive E coli (EIEC) NOT DETECTED NOT DETECTED Final   Cryptosporidium NOT DETECTED NOT DETECTED Final   Cyclospora cayetanensis NOT DETECTED NOT DETECTED Final   Entamoeba histolytica NOT DETECTED NOT DETECTED Final   Giardia lamblia NOT DETECTED NOT DETECTED Final   Adenovirus F40/41 NOT DETECTED NOT DETECTED Final   Astrovirus NOT DETECTED NOT DETECTED Final   Norovirus GI/GII NOT DETECTED NOT DETECTED Final   Rotavirus A NOT DETECTED NOT DETECTED Final   Sapovirus (I, II, IV, and V) NOT DETECTED NOT DETECTED Final    Comment: Performed at [facility], [address] [place], [place] [address]  C Difficile Quick Screen w PCR  reflex     Status: None   Collection Time: 10/01/20 12:16 PM   Specimen: STOOL  Result Value Ref Range Status   C Diff antigen NEGATIVE NEGATIVE Final   C Diff toxin NEGATIVE NEGATIVE Final   C Diff interpretation No C. difficile detected.  Final    Comment: Performed at [facility], [address] [place], [place] [address]  Resp Panel by RT-PCR (Flu A&B, Covid) Nasopharyngeal Swab     Status: None   Collection Time: 10/03/20  6:40 PM   Specimen: Nasopharyngeal Swab; Nasopharyngeal(NP) swabs in vial transport medium  Result Value Ref Range Status   SARS Coronavirus 2 by RT PCR NEGATIVE NEGATIVE Final    Comment: (NOTE) SARS-CoV-2 target nucleic acids are NOT DETECTED.  The SARS-CoV-2 RNA  is generally detectable in upper respiratory specimens during the acute phase of infection. The lowest concentration of SARS-CoV-2 viral copies this assay can detect is 138 copies/mL. A negative result does not preclude SARS-Cov-2 infection and should not be used as the sole basis for treatment or other patient management decisions. A negative result may occur with  improper specimen collection/handling, submission of specimen other than nasopharyngeal swab, presence of viral mutation(s) within the areas targeted by this assay, and inadequate number of viral copies(<138 copies/mL). A negative result must be combined with clinical observations, patient history, and epidemiological information. The expected result is Negative.  Fact Sheet for Patients:  EntrepreneurPulse.com.au  Fact Sheet for Healthcare Providers:  IncredibleEmployment.be  This test is no t yet approved or cleared by the [place] FDA and  has been authorized for detection and/or diagnosis of SARS-CoV-2 by FDA under an Emergency Use Authorization (EUA). This EUA will remain  in effect (meaning this test can be used) for the duration of the COVID-19 declaration under Section  564(b)(1) of the Act, 21 U.S.C.section 360bbb-3(b)(1), unless the authorization is terminated  or revoked sooner.       Influenza A by PCR NEGATIVE NEGATIVE Final   Influenza B by PCR NEGATIVE NEGATIVE Final    Comment: (NOTE) The Xpert Xpress SARS-CoV-2/FLU/RSV plus assay is intended as an aid in the diagnosis of influenza from Nasopharyngeal swab specimens and should not be used as a sole basis for treatment. Nasal washings and aspirates are unacceptable for Xpert Xpress SARS-CoV-2/FLU/RSV testing.  Fact Sheet for Patients: EntrepreneurPulse.com.au  Fact Sheet for Healthcare Providers: IncredibleEmployment.be  This test is not yet approved or cleared by the [place] FDA and has been authorized for detection and/or diagnosis of SARS-CoV-2 by FDA under an Emergency Use Authorization (EUA). This EUA will remain in effect (meaning this test can be used) for the duration of the COVID-19 declaration under Section 564(b)(1) of the Act, 21 U.S.C. section 360bbb-3(b)(1), unless the authorization is terminated or revoked.  Performed at [facility], [address] [place], [place] [address]     IMAGING: [place] [facility] 2 View  Result Date: 09/30/2020 CLINICAL DATA:  Congestion EXAM: CHEST - 2 VIEW COMPARISON:  Nov 20, 2016 FINDINGS: The heart size and mediastinal contours are within normal limits. Both lungs are clear. The visualized skeletal structures are unremarkable. IMPRESSION: No active cardiopulmonary disease. Electronically Signed   By: Constance Holster M.D.   On: 09/30/2020 22:12   CT Renal Stone Study  Result Date: 10/01/2020 CLINICAL DATA:  Hematuria EXAM: CT ABDOMEN AND PELVIS WITHOUT CONTRAST TECHNIQUE: Multidetector CT imaging of the abdomen and pelvis was performed following the standard protocol without IV contrast. COMPARISON:  CT pelvis 08/07/2014 FINDINGS: Lower chest: The lung bases are clear. Hepatobiliary: No focal  liver abnormality is seen. No gallstones, gallbladder wall thickening, or biliary dilatation. Pancreas: Unremarkable. No pancreatic ductal dilatation or surrounding inflammatory changes. 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 Adrenals/Urinary Tract: Adrenal glands are unremarkable. Kidneys are normal, without renal calculi, focal lesion, or hydronephrosis. Bladder is unremarkable. Stomach/Bowel: Stomach is within normal limits. Appendix appears normal. No evidence of bowel wall thickening, distention, or inflammatory changes. Vascular/Lymphatic: No significant vascular findings are present. No enlarged abdominal or pelvic lymph nodes. Reproductive: Uterus and bilateral adnexa are unremarkable. Other: No abdominal wall hernia or abnormality. No abdominopelvic ascites. Musculoskeletal: No acute or significant osseous findings. IMPRESSION: No acute process demonstrated in the abdomen or pelvis. No renal or ureteral  stone or obstruction. Absence of the spleen. Electronically Signed   By: Lucienne Capers M.D.   On: 10/01/2020 00:59   US BIOPSY (KIDNEY)  Result Date: 10/04/2020 INDICATION: 39 year old male with a history hematuria and proteinuria EXAM: IMAGE GUIDED MEDICAL RENAL BIOPSY MEDICATIONS: None. ANESTHESIA/SEDATION: Moderate (conscious) sedation was employed during this procedure. A total of Versed 2.0 mg and Fentanyl 100 mcg was administered intravenously. Moderate Sedation Time: 10 minutes. The patient's level of consciousness and vital signs were monitored continuously by radiology nursing throughout the procedure under my direct supervision. FLUOROSCOPY TIME:  Ultrasound COMPLICATIONS: NONE PROCEDURE: Informed written consent was obtained from the patient after a thorough discussion of the procedural risks, benefits and alternatives. All questions were addressed. Maximal Sterile Barrier Technique was utilized including caps, mask, sterile gowns, sterile gloves, sterile drape,  hand hygiene and skin antiseptic. A timeout was performed prior to the initiation of the procedure. Patient was positioned prone position on the gantry table. Images were stored sent to PACs. Once the patient is prepped and draped in the usual sterile fashion, the skin and subcutaneous tissues overlying the left kidney were generously infiltrated 1% lidocaine for local anesthesia. Using ultrasound guidance, a 17 gauge guide needle was advanced into the lower cortex of the left kidney. Once we confirmed location of the needle tip, 4 separate 18 gauge core biopsy were achieved. Two Gel-Foam pledgets were infused with a small amount of saline. The needle was removed. Final images were stored. The patient tolerated the procedure well and remained hemodynamically stable throughout. No complications were encountered and no significant blood loss encountered. IMPRESSION: Status post image guided medical renal biopsy. Signed, Dulcy Fanny. Dellia Nims, RPVI [facility] Electronically Signed   By: Corrie Mckusick D.O.   On: 10/04/2020 13:33   CT TEMPORAL BONES WO CONTRAST  Result Date: 10/03/2020 CLINICAL DATA:  Hearing loss status post cochlear implant. Fever of unknown origin. Headache. EXAM: CT TEMPORAL BONES WITHOUT CONTRAST TECHNIQUE: Axial and coronal plane CT imaging of the petrous temporal bones was performed with thin-collimation image reconstruction. No intravenous contrast was administered. Multiplanar CT image reconstructions were also generated. COMPARISON:  None. FINDINGS: RIGHT: The external auditory canal is unremarkable. The ossicles appear intact. The tympanic cavity is clear. The internal auditory canal, and semicircular canals are unremarkable. The cochlea is abnormal with a deficient modiolus and absent septation between the middle and apical turns. The vestibular aqueduct is enlarged. The vestibule is also mildly enlarged. The mastoid air cells are  clear. LEFT: Sequelae of canal wall up mastoidectomy are identified. A cochlear implant is in place. There is streak artifact about the external portion of the implant without a gross fluid collection identified. As on the contralateral side, the vestibule and vestibular aqueduct are enlarged. The cochlea is also likely abnormal although the presence of the implant limits assessment. The tympanic cavity and residual mastoid air cells are clear. The ossicles appear intact. The internal auditory canal and semicircular canals are unremarkable. There is mild scattered mucosal thickening in the paranasal sinuses there is a 1 cm bony defect in the posterior wall of the left maxillary sinus without inflammatory changes in the retro antral fat. The visualized portions of the orbits and brain are unremarkable. IMPRESSION: 1. Left cochlear implant in place. No fluid collection or other acute abnormality identified. 2. 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 Electronically Signed   By: Zenia Resides  Jeralyn Ruths M.D.   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  He has chronic headaches and these have been bothering him a little more recently but no other focal findings.  CT of the Templer bone is negative for any evidence of infection with the cochlear implant.  His symptomology is consistent with a splenic sepsis.  He fortunately had taken a dose of Augmentin as instructed in the past at the first onset of fever so his cultures may be negative because of that.  Recommendations I would recommend continuing ceftriaxone at this point.   I have ordered an echocardiogram.   If he continues to clinically improve he could be discharged with levofloxacin to complete a total of  10 days of treatment for asplenic sepsis. I can see in outpatient follow-up.   If fever recurs or worsening HA would consider LP. Will need to review his vaccination record and update as otpt given asplenic status.   Thank you very much for allowing me to participate in the care of this patient. Please call with questions.   Cheral Marker. Ola Spurr, MD

## 2020-10-04 NOTE — Progress Notes (Signed)
[facility], [place] 10/04/20  Subjective:   Hospital day # 3  Patient underwent kidney biopsy today Parents are in the room Tolerated the biopsy     04/06 0701 - 04/07 0700 In: 320 [P.O.:320] Out: 1750 [Urine:1750] Lab Results  Component Value Date   CREATININE 1.83 (H) 10/04/2020   CREATININE 1.93 (H) 10/03/2020   CREATININE 1.99 (H) 10/02/2020     Objective:  Vital signs in last 24 hours:  Temp:  [97.7 F (36.5 C)-100.2 F (37.9 C)] 98.5 F (36.9 C) (04/07 1129) Pulse Rate:  [86-104] 104 (04/07 1129) Resp:  [12-20] 18 (04/07 1129) BP: (141-167)/(88-98) 142/89 (04/07 1129) SpO2:  [96 %-100 %] 97 % (04/07 1129) Weight:  [87.6 kg] 87.6 kg (04/07 0500)  Weight change: 0.4 kg Filed Weights   09/30/20 2116 10/03/20 0601 10/04/20 0500  Weight: 87.5 kg 87.2 kg 87.6 kg    Intake/Output:    Intake/Output Summary (Last 24 hours) at 10/04/2020 1431 Last data filed at 10/04/2020 0947 Gross per 24 hour  Intake 120 ml  Output 1575 ml  Net -1455 ml    Physical Exam: General:  No acute distress, laying in the bed  HEENT  anicteric, moist oral mucous membrane, hearing loss, implant  Pulm/lungs  normal breathing effort, lungs are clear to auscultation  CVS/Heart  regular rhythm, no rub or gallop  Abdomen:   Soft, nontender  Extremities:  No peripheral edema  Neurologic:  Alert, oriented, able to follow commands  Skin:  No acute rashes       Basic Metabolic Panel:  Recent Labs  Lab 09/30/20 2127 10/01/20 0507 10/01/20 1204 10/02/20 0537 10/03/20 0502 10/04/20 0554  NA 136  --  138 139 137 138  K 3.7  --  4.4 4.1 3.5 3.4*  CL 106  --  106 110 111 108  CO2 23  --  22 22 19* 20*  GLUCOSE 99  --  134* 99 89 87  BUN 28*  --  31* 32* 24* 22*  CREATININE 1.89* 2.07* 2.43* 1.99* 1.93* 1.83*  CALCIUM 8.2*  --  7.5* 7.9* 7.7* 8.0*     CBC: Recent Labs  Lab 09/30/20 2127 10/01/20 0507 10/02/20 0537 10/03/20 0502 10/04/20 0554   WBC 17.0* 31.8* 28.3* 23.8* 22.7*  NEUTROABS 15.7*  --   --   --   --   HGB 12.0* 11.0* 9.7* 9.5* 9.7*  HCT 33.9* 31.5* 28.5* 27.4* 27.3*  MCV 82.3 82.7 84.3 83.3 81.7  PLT 302 224 194 187 191      Lab Results  Component Value Date   HEPBSAG NON REACTIVE 10/03/2020   HEPBSAB NON REACTIVE 10/03/2020      Microbiology:  Recent Results (from the past 240 hour(s))  Urine Culture     Status: None   Collection Time: 09/30/20  9:25 PM   Specimen: Urine, Random  Result Value Ref Range Status   Specimen Description   Final    URINE, RANDOM Performed at [facility], [address] [place], [place] [address]    Special Requests   Final    NONE Performed at [facility], [address], [place], [place] [address]    Culture   Final    NO GROWTH Performed at [facility], [address], [place], [place] [address]    Report Status 10/02/2020 FINAL  Final  Resp Panel by RT-PCR (Flu A&B, Covid) Nasopharyngeal Swab     Status: None  Collection Time: 10/01/20 12:23 AM   Specimen: Nasopharyngeal Swab; Nasopharyngeal(NP) swabs in vial transport medium  Result Value Ref Range Status   SARS Coronavirus 2 by RT PCR NEGATIVE NEGATIVE Final    Comment: (NOTE) SARS-CoV-2 target nucleic acids are NOT DETECTED.  The SARS-CoV-2 RNA is generally detectable in upper respiratory specimens during the acute phase of infection. The lowest concentration of SARS-CoV-2 viral copies this assay can detect is 138 copies/mL. A negative result does not preclude SARS-Cov-2 infection and should not be used as the sole basis for treatment or other patient management decisions. A negative result may occur with  improper specimen collection/handling, submission of specimen other than nasopharyngeal swab, presence of viral mutation(s) within the areas targeted by this assay, and inadequate number of viral copies(<138 copies/mL). A negative result must be combined with clinical  observations, patient history, and epidemiological information. The expected result is Negative.  Fact Sheet for Patients:  BloggerCourse.com  Fact Sheet for Healthcare Providers:  SeriousBroker.it  This test is no t yet approved or cleared by the [place] FDA and  has been authorized for detection and/or diagnosis of SARS-CoV-2 by FDA under an Emergency Use Authorization (EUA). This EUA will remain  in effect (meaning this test can be used) for the duration of the COVID-19 declaration under Section 564(b)(1) of the Act, 21 U.S.C.section 360bbb-3(b)(1), unless the authorization is terminated  or revoked sooner.       Influenza A by PCR NEGATIVE NEGATIVE Final   Influenza B by PCR NEGATIVE NEGATIVE Final    Comment: (NOTE) The Xpert Xpress SARS-CoV-2/FLU/RSV plus assay is intended as an aid in the diagnosis of influenza from Nasopharyngeal swab specimens and should not be used as a sole basis for treatment. Nasal washings and aspirates are unacceptable for Xpert Xpress SARS-CoV-2/FLU/RSV testing.  Fact Sheet for Patients: BloggerCourse.com  Fact Sheet for Healthcare Providers: SeriousBroker.it  This test is not yet approved or cleared by the [place] FDA and has been authorized for detection and/or diagnosis of SARS-CoV-2 by FDA under an Emergency Use Authorization (EUA). This EUA will remain in effect (meaning this test can be used) for the duration of the COVID-19 declaration under Section 564(b)(1) of the Act, 21 U.S.C. section 360bbb-3(b)(1), unless the authorization is terminated or revoked.  Performed at [facility], [address] [place], [place] [address]   Blood culture (routine x 2)     Status: None (Preliminary result)   Collection Time: 10/01/20 12:36 AM   Specimen: BLOOD  Result Value Ref Range Status   Specimen Description BLOOD  RIGHT  HAND  Final   Special Requests   Final    BOTTLES DRAWN AEROBIC AND ANAEROBIC Blood Culture adequate volume   Culture   Final    NO GROWTH 3 DAYS Performed at [facility], [address], [place], [place] [address]    Report Status PENDING  Incomplete  Blood culture (routine x 2)     Status: None (Preliminary result)   Collection Time: 10/01/20 12:57 AM   Specimen: BLOOD  Result Value Ref Range Status   Specimen Description BLOOD LEFT [facility]  Final   Special Requests   Final    BOTTLES DRAWN AEROBIC AND ANAEROBIC Blood Culture adequate volume   Culture   Final    NO GROWTH 3 DAYS Performed at [facility], [address], [place], [place] [address]    Report Status PENDING  Incomplete  Gastrointestinal Panel by PCR , Stool  Status: None   Collection Time: 10/01/20 12:16 PM   Specimen: STOOL  Result Value Ref Range Status   Campylobacter species NOT DETECTED NOT DETECTED Final   Plesimonas shigelloides NOT DETECTED NOT DETECTED Final   Salmonella species NOT DETECTED NOT DETECTED Final   Yersinia enterocolitica NOT DETECTED NOT DETECTED Final   Vibrio species NOT DETECTED NOT DETECTED Final   Vibrio cholerae NOT DETECTED NOT DETECTED Final   Enteroaggregative E coli (EAEC) NOT DETECTED NOT DETECTED Final   Enteropathogenic E coli (EPEC) NOT DETECTED NOT DETECTED Final   Enterotoxigenic E coli (ETEC) NOT DETECTED NOT DETECTED Final   Shiga like toxin producing E coli (STEC) NOT DETECTED NOT DETECTED Final   Shigella/Enteroinvasive E coli (EIEC) NOT DETECTED NOT DETECTED Final   Cryptosporidium NOT DETECTED NOT DETECTED Final   Cyclospora cayetanensis NOT DETECTED NOT DETECTED Final   Entamoeba histolytica NOT DETECTED NOT DETECTED Final   Giardia lamblia NOT DETECTED NOT DETECTED Final   Adenovirus F40/41 NOT DETECTED NOT DETECTED Final   Astrovirus NOT DETECTED NOT DETECTED Final   Norovirus GI/GII NOT DETECTED NOT DETECTED Final   Rotavirus A NOT DETECTED  NOT DETECTED Final   Sapovirus (I, II, IV, and V) NOT DETECTED NOT DETECTED Final    Comment: Performed at [facility], [address] [place], [place] [address]  C Difficile Quick Screen w PCR reflex     Status: None   Collection Time: 10/01/20 12:16 PM   Specimen: STOOL  Result Value Ref Range Status   C Diff antigen NEGATIVE NEGATIVE Final   C Diff toxin NEGATIVE NEGATIVE Final   C Diff interpretation No C. difficile detected.  Final    Comment: Performed at [facility], [address] [place], [place] [address]  Resp Panel by RT-PCR (Flu A&B, Covid) Nasopharyngeal Swab     Status: None   Collection Time: 10/03/20  6:40 PM   Specimen: Nasopharyngeal Swab; Nasopharyngeal(NP) swabs in vial transport medium  Result Value Ref Range Status   SARS Coronavirus 2 by RT PCR NEGATIVE NEGATIVE Final    Comment: (NOTE) SARS-CoV-2 target nucleic acids are NOT DETECTED.  The SARS-CoV-2 RNA is generally detectable in upper respiratory specimens during the acute phase of infection. The lowest concentration of SARS-CoV-2 viral copies this assay can detect is 138 copies/mL. A negative result does not preclude SARS-Cov-2 infection and should not be used as the sole basis for treatment or other patient management decisions. A negative result may occur with  improper specimen collection/handling, submission of specimen other than nasopharyngeal swab, presence of viral mutation(s) within the areas targeted by this assay, and inadequate number of viral copies(<138 copies/mL). A negative result must be combined with clinical observations, patient history, and epidemiological information. The expected result is Negative.  Fact Sheet for Patients:  BloggerCourse.com  Fact Sheet for Healthcare Providers:  SeriousBroker.it  This test is no t yet approved or cleared by the [place] FDA and  has been authorized for detection  and/or diagnosis of SARS-CoV-2 by FDA under an Emergency Use Authorization (EUA). This EUA will remain  in effect (meaning this test can be used) for the duration of the COVID-19 declaration under Section 564(b)(1) of the Act, 21 U.S.C.section 360bbb-3(b)(1), unless the authorization is terminated  or revoked sooner.       Influenza A by PCR NEGATIVE NEGATIVE Final   Influenza B by PCR NEGATIVE NEGATIVE Final    Comment: (NOTE) The Xpert Xpress SARS-CoV-2/FLU/RSV plus assay is  intended as an aid in the diagnosis of influenza from Nasopharyngeal swab specimens and should not be used as a sole basis for treatment. Nasal washings and aspirates are unacceptable for Xpert Xpress SARS-CoV-2/FLU/RSV testing.  Fact Sheet for Patients: BloggerCourse.comhttps://www.fda.gov/media/152166/download  Fact Sheet for Healthcare Providers: SeriousBroker.ithttps://www.fda.gov/media/152162/download  This test is not yet approved or cleared by the [place] FDA and has been authorized for detection and/or diagnosis of SARS-CoV-2 by FDA under an Emergency Use Authorization (EUA). This EUA will remain in effect (meaning this test can be used) for the duration of the COVID-19 declaration under Section 564(b)(1) of the Act, 21 U.S.C. section 360bbb-3(b)(1), unless the authorization is terminated or revoked.  Performed at [facility], [address] [place], [place] [id number]     Coagulation Studies: Recent Labs    10/04/20 0554  LABPROT 14.1  INR 1.1    Urinalysis: Recent Labs    10/03/20 1415  COLORURINE STRAW*  LABSPEC 1.003*  PHURINE 5.0  GLUCOSEU NEGATIVE  HGBUR LARGE*  BILIRUBINUR NEGATIVE  KETONESUR NEGATIVE  PROTEINUR 100*  NITRITE NEGATIVE  LEUKOCYTESUR TRACE*      Imaging: US BIOPSY (KIDNEY)  Result Date: 10/04/2020 INDICATION: [id number] year old male with a history hematuria and proteinuria EXAM: IMAGE GUIDED MEDICAL RENAL BIOPSY MEDICATIONS: None. ANESTHESIA/SEDATION: Moderate (conscious)  sedation was employed during this procedure. A total of Versed 2.0 mg and Fentanyl 100 mcg was administered intravenously. Moderate Sedation Time: 10 minutes. The patient's level of consciousness and vital signs were monitored continuously by radiology nursing throughout the procedure under my direct supervision. FLUOROSCOPY TIME:  Ultrasound COMPLICATIONS: NONE PROCEDURE: Informed written consent was obtained from the patient after a thorough discussion of the procedural risks, benefits and alternatives. All questions were addressed. Maximal Sterile Barrier Technique was utilized including caps, mask, sterile gowns, sterile gloves, sterile drape, hand hygiene and skin antiseptic. A timeout was performed prior to the initiation of the procedure. Patient was positioned prone position on the gantry table. Images were stored sent to PACs. Once the patient is prepped and draped in the usual sterile fashion, the skin and subcutaneous tissues overlying the left kidney were generously infiltrated 1% lidocaine for local anesthesia. Using ultrasound guidance, a 17 gauge guide needle was advanced into the lower cortex of the left kidney. Once we confirmed location of the needle tip, 4 separate 18 gauge core biopsy were achieved. Two Gel-Foam pledgets were infused with a small amount of saline. The needle was removed. Final images were stored. The patient tolerated the procedure well and remained hemodynamically stable throughout. No complications were encountered and no significant blood loss encountered. IMPRESSION: Status post image guided medical renal biopsy. Signed, Yvone NeuJaime S. Reyne DumasWagner, DO, RPVI [facility] Inter[facility] Special[facility] Electronically Signed   By: Gilmer MorJaime  Wagner D.O.   On: 10/04/2020 13:33   CT TEMPORAL BONES WO CONTRAST  Result Date: 10/03/2020 CLINICAL DATA:  Hearing loss status post cochlear implant. Fever of unknown origin. Headache. EXAM: CT TEMPORAL BONES WITHOUT CONTRAST  TECHNIQUE: Axial and coronal plane CT imaging of the petrous temporal bones was performed with thin-collimation image reconstruction. No intravenous contrast was administered. Multiplanar CT image reconstructions were also generated. COMPARISON:  None. FINDINGS: RIGHT: The external auditory canal is unremarkable. The ossicles appear intact. The tympanic cavity is clear. The internal auditory canal, and semicircular canals are unremarkable. The cochlea is abnormal with a deficient modiolus and absent septation between the middle and apical turns. The vestibular aqueduct is enlarged. The vestibule is also mildly  enlarged. The mastoid air cells are clear. LEFT: Sequelae of canal wall up mastoidectomy are identified. A cochlear implant is in place. There is streak artifact about the external portion of the implant without a gross fluid collection identified. As on the contralateral side, the vestibule and vestibular aqueduct are enlarged. The cochlea is also likely abnormal although the presence of the implant limits assessment. The tympanic cavity and residual mastoid air cells are clear. The ossicles appear intact. The internal auditory canal and semicircular canals are unremarkable. There is mild scattered mucosal thickening in the paranasal sinuses there is a 1 cm bony defect in the posterior wall of the left maxillary sinus without inflammatory changes in the retro antral fat. The visualized portions of the orbits and brain are unremarkable. IMPRESSION: 1. Left cochlear implant in place. No fluid collection or other acute abnormality identified. 2. 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 Electronically Signed   By: Sebastian Ache M.D.   On: 10/03/2020 18:37     Medications:   . cefTRIAXone (ROCEPHIN)  IV 1 g (10/03/20 2328)   . hydrALAZINE  50 mg Oral Q8H  . irbesartan  75 mg Oral Daily   . melatonin  5 mg Oral QHS   acetaminophen **OR** acetaminophen, hydrALAZINE, ondansetron **OR** ondansetron (ZOFRAN) IV  Assessment/ Plan:  40 y.o. 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  Bladder is unremarkable. Urinalysis September 30, 2020: Large hemoglobin, greater than 300 protein, greater than 50 RBCs, 21-50 WBCs UPC 5.56 gm. Renal biopsy 10/04/2020 Hep B s Ag and Ab neg, Hep C Ab neg; HIV neg   Plan: We will repeat urinalysis, obtain urine protein to creatinine ratio Renal biopsy completed on October 04, 2020 Await preliminary result Family still reports low-grade fever in the afternoons but details Consider ID consult Stat oral iron.       LOS: 3 Webber Michiels 4/7/20222:31 PM  [facility] [place], [place] [phone]  Note: This note was prepared with Dragon dictation. Any transcription errors are unintentional

## 2020-10-04 NOTE — Progress Notes (Signed)
Patient clinically stable post Renal biopsy per DR Loreta Ave, tolerated well. Received Versed 2 mg along with Fentanyl 100 mcg IV for procedure. Vitals stable post procedure. Awake/alert and oriented post procedure. Bedside report given to [place] RN post procedure along with parents at bedside. Denies complaints post procedure.

## 2020-10-05 ENCOUNTER — Emergency Department
Admission: EM | Admit: 2020-10-05 | Discharge: 2020-10-05 | Disposition: A | Discharge status: Home / Self Care | Payer: BC Managed Care – PPO | Attending: Emergency Medicine | Admitting: Emergency Medicine

## 2020-10-05 ENCOUNTER — Other Ambulatory Visit: Payer: Self-pay

## 2020-10-05 ENCOUNTER — Encounter: Payer: Self-pay | Admitting: Intensive Care

## 2020-10-05 ENCOUNTER — Inpatient Hospital Stay (HOSPITAL_COMMUNITY)
Admit: 2020-10-05 | Discharge: 2020-10-05 | Disposition: A | Discharge status: Home / Self Care | Payer: BC Managed Care – PPO | Attending: Infectious Diseases | Admitting: Infectious Diseases

## 2020-10-05 ENCOUNTER — Encounter: Payer: Self-pay | Admitting: Nephrology

## 2020-10-05 DIAGNOSIS — Z7689 Persons encountering health services in other specified circumstances: Secondary | ICD-10-CM

## 2020-10-05 DIAGNOSIS — F17210 Nicotine dependence, cigarettes, uncomplicated: Secondary | ICD-10-CM | POA: Insufficient documentation

## 2020-10-05 DIAGNOSIS — R7881 Bacteremia: Secondary | ICD-10-CM

## 2020-10-05 DIAGNOSIS — Z5181 Encounter for therapeutic drug level monitoring: Secondary | ICD-10-CM | POA: Diagnosis not present

## 2020-10-05 DIAGNOSIS — N159 Renal tubulo-interstitial disease, unspecified: Secondary | ICD-10-CM | POA: Insufficient documentation

## 2020-10-05 DIAGNOSIS — Z7952 Long term (current) use of systemic steroids: Secondary | ICD-10-CM | POA: Insufficient documentation

## 2020-10-05 LAB — BASIC METABOLIC PANEL
Anion gap: 10 (ref 5–15)
BUN: 21 mg/dL — ABNORMAL HIGH (ref 6–20)
CO2: 20 mmol/L — ABNORMAL LOW (ref 22–32)
Calcium: 8.4 mg/dL — ABNORMAL LOW (ref 8.9–10.3)
Chloride: 108 mmol/L (ref 98–111)
Creatinine, Ser: 1.94 mg/dL — ABNORMAL HIGH (ref 0.61–1.24)
GFR, Estimated: 44 mL/min — ABNORMAL LOW (ref >60)
Glucose, Bld: 87 mg/dL (ref 70–99)
Potassium: 3.4 mmol/L — ABNORMAL LOW (ref 3.5–5.1)
Sodium: 138 mmol/L (ref 135–145)

## 2020-10-05 LAB — CBC
HCT: 28.1 % — ABNORMAL LOW (ref 39.0–52.0)
Hemoglobin: 9.9 g/dL — ABNORMAL LOW (ref 13.0–17.0)
MCH: 28.5 pg (ref 26.0–34.0)
MCHC: 35.2 g/dL (ref 30.0–36.0)
MCV: 81 fL (ref 80.0–100.0)
Platelets: 199 10*3/uL (ref 150–400)
RBC: 3.47 MIL/uL — ABNORMAL LOW (ref 4.22–5.81)
RDW: 16.1 % — ABNORMAL HIGH (ref 11.5–15.5)
WBC: 16.7 10*3/uL — ABNORMAL HIGH (ref 4.0–10.5)
nRBC: 0.1 % (ref 0.0–0.2)

## 2020-10-05 LAB — ECHOCARDIOGRAM COMPLETE
AR max vel: 3.28 cm2
AV Area VTI: 3.55 cm2
AV Area mean vel: 3.2 cm2
AV Mean grad: 4.5 mmHg
AV Peak grad: 7.9 mmHg
Ao pk vel: 1.41 m/s
Area-P 1/2: 3.85 cm2
Height: 71 in
S' Lateral: 2.78 cm
Weight: 3037.06 oz

## 2020-10-05 MED ORDER — FERROUS SULFATE 325 (65 FE) MG PO TABS: 325.0000 mg | ORAL_TABLET | Freq: Every day | ORAL | 0 refills | Status: AC

## 2020-10-05 MED ORDER — SODIUM CHLORIDE 0.9 % IV SOLN
1000.0000 mg | Freq: Once | INTRAVENOUS | Status: AC
  Administered 2020-10-05: 1000 mg via INTRAVENOUS
  Filled 2020-10-05: qty 8

## 2020-10-05 MED ORDER — LEVOFLOXACIN 500 MG PO TABS: 500.0000 mg | ORAL_TABLET | Freq: Every day | ORAL | 0 refills | Status: AC

## 2020-10-05 MED ORDER — HYDRALAZINE HCL 50 MG PO TABS: 75.0000 mg | ORAL_TABLET | Freq: Three times a day (TID) | ORAL | 0 refills | Status: AC

## 2020-10-05 MED ORDER — IRBESARTAN 150 MG PO TABS
150.0000 mg | ORAL_TABLET | Freq: Every day | ORAL | Status: DC
  Administered 2020-10-05: 150 mg via ORAL
  Filled 2020-10-05: qty 1

## 2020-10-05 MED ORDER — IRBESARTAN 150 MG PO TABS: 150.0000 mg | ORAL_TABLET | Freq: Every day | ORAL | 0 refills | Status: AC

## 2020-10-05 NOTE — Progress Notes (Signed)
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 Cellular crescents reported.  Plan to give iv solumedrol 1000 mg iv x 3 days.

## 2020-10-05 NOTE — Progress Notes (Signed)
*  PRELIMINARY RESULTS* Echocardiogram 2D Echocardiogram has been performed.  Jeremiah [name] 10/05/2020, 9:20 AM

## 2020-10-05 NOTE — ED Provider Notes (Signed)
[facility] Emergency Department Provider Note  ____________________________________________   I have reviewed the triage vital signs and the nursing notes.   HISTORY  Chief Complaint None. Told to come to ED by nephrologist.  History limited by: Not Limited   HPI Jeremiah [name] is a 40 y.o. male who presents to the emergency department today with the understanding that he is supposed to get a dose of IV steroids. The patient states that he was discharged from the hospital earlier today and was then told by the nephrologist that he needs a dose of IV steroids. He denies any complaints.    Records reviewed. Per medical record review patient has a history of recent admission to the hospital.   History reviewed. No pertinent past medical history.  Patient Active Problem List   Diagnosis Date Noted  . Sepsis (HCC) 10/01/2020  . 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 Acute renal failure (HCC) 10/01/2020  . Elevated BP without diagnosis of hypertension 10/01/2020  . URI (upper respiratory infection) 10/01/2020  . History of cochlear implant 10/01/2020  . Plantar fasciitis 12/01/2016  . 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 Allergic rhinitis 02/08/2015  . Apnea, sleep 02/08/2015  . 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 COCHLEAR IMPLANT     2000 and 2007  . SPLENECTOMY  1988  . WISDOM TOOTH EXTRACTION  03/2014    Prior to Admission medications   Medication Sig Start Date End Date Taking? Authorizing Provider  ferrous sulfate 325 (65 FE) MG tablet Take 1 tablet (325 mg total) by mouth daily with breakfast. 10/06/20 01/04/21  [place], Alvira Philips, DO  hydrALAZINE (APRESOLINE) 50 MG tablet Take 1.5 tablets (75 mg total) by mouth every 8 (eight) hours. 10/05/20 01/03/21  [place], Eric J, DO  irbesartan (AVAPRO) 150 MG tablet Take 1 tablet (150 mg total) by mouth daily. 10/06/20 01/04/21  [place], Alvira Philips, DO  levofloxacin (LEVAQUIN) 500  MG tablet Take 1 tablet (500 mg total) by mouth daily for 6 days. 10/05/20 10/11/20  [place], Alvira Philips, DO  tobramycin-dexamethasone [facility]) ophthalmic solution Place 2 drops into both eyes 2 (two) times daily. For 4 days 09/29/20   [provider]    Allergies Aspirin  Family History  Problem Relation Age of Onset  . Hyperlipidemia Father     Social History Social History   Tobacco Use  . Smoking status: Current Some Day Smoker    Types: Cigarettes  . Smokeless tobacco: Current User    Types: Snuff  Substance Use Topics  . Alcohol use: Yes    Alcohol/week: 15.0 standard drinks    Types: 15 Cans of beer per week  . Drug use: No    Review of Systems Constitutional: No fever/chills Eyes: No visual changes. ENT: No sore throat. Cardiovascular: Denies chest pain. Respiratory: Denies shortness of breath. Gastrointestinal: No abdominal pain.  No nausea, no vomiting.  No diarrhea.   Genitourinary: Negative for dysuria. Musculoskeletal: Negative for back pain. Skin: Negative for rash. Neurological: Negative for headaches, focal weakness or numbness.  ____________________________________________   PHYSICAL EXAM:  VITAL SIGNS: ED Triage Vitals  Enc Vitals Group     BP 10/05/20 1608 (!) 162/99     Pulse Rate 10/05/20 1608 (!) 105     Resp 10/05/20 1608 18     Temp 10/05/20 1608 99.3 F (37.4 C)     Temp Source 10/05/20 1608 Oral     SpO2 10/05/20 1608 97 %  Weight 10/05/20 1610 193 lb (87.5 kg)     Height 10/05/20 1610 5\' 11"  (1.803 m)     Head Circumference --      Peak Flow --      Pain Score 10/05/20 1610 0   Constitutional: Alert and oriented.  Eyes: Conjunctivae are normal.  ENT      Head: Normocephalic and atraumatic.      Nose: No congestion/rhinnorhea.      Mouth/Throat: Mucous membranes are moist.      Neck: No stridor. Hematological/Lymphatic/Immunilogical: No cervical lymphadenopathy. Cardiovascular: Normal rate, regular rhythm.  No murmurs,  rubs, or gallops.  Respiratory: Normal respiratory effort without tachypnea nor retractions. Breath sounds are clear and equal bilaterally. No wheezes/rales/rhonchi. Gastrointestinal: Soft and non tender. No rebound. No guarding.  Genitourinary: Deferred Musculoskeletal: Normal range of motion in all extremities. No lower extremity edema. Neurologic:  Hx of cochlear implants.  Skin:  Skin is warm, dry and intact. No rash noted. Psychiatric: Mood and affect are normal. Speech and behavior are normal. Patient exhibits appropriate insight and judgment.  ____________________________________________    LABS (pertinent positives/negatives)  None  ____________________________________________   EKG  None  ____________________________________________    RADIOLOGY  None  ____________________________________________   PROCEDURES  Procedures  ____________________________________________   INITIAL IMPRESSION / ASSESSMENT AND PLAN / ED COURSE  Pertinent labs & imaging results that were available during my care of the patient were reviewed by me and considered in my medical decision making (see chart for details).   Patient presented to the emergency department at the request of nephrologist to get a dose of IV solumedrol. Nephrologist does not think the patient requires admission. The patient himself does not have any complaints. Will give dose of steroid and discharge.   ____________________________________________   FINAL CLINICAL IMPRESSION(S) / ED DIAGNOSES  Final diagnoses:  Encounter for medication administration     Note: This dictation was prepared with Dragon dictation. Any transcriptional errors that result from this process are unintentional     12/05/20, MD 10/05/20 [id number]

## 2020-10-05 NOTE — Discharge Summary (Signed)
Physician Discharge Summary  OBERT ESPINDOLA ZOX:[id number] DOB: Feb 07, 1981 DOA: 09/30/2020  PCP: Maple Hudson., MD  Admit date: 09/30/2020 Discharge date: 10/05/2020  Admitted From: Home Disposition: Home  Recommendations for Outpatient Follow-up:  1. Follow up with PCP in 1-2 weeks 2. Follow-up with nephrology, Dr. Thedore Mins as scheduled on 10/08/2020 3. Started on hydralazine 75 mg p.o. 3 times daily 4. Started on irbesartan 150 mg p.o. daily 5. Continue Levaquin 500 mg p.o. 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 Needs close outpatient follow-up with ENT for resumption of CPAP 7. Please obtain CBC/BMP in one week 8. Please follow up on the following pending results: Echocardiogram results pending at time of discharge, renal biopsy pathology pending at time of discharge  Home Health: No Equipment/Devices: None  Discharge Condition: Stable CODE STATUS: Full code Diet recommendation: Heart healthy diet  History of present illness:  Jeremiah [name] is a 40 y.o. 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  Patient has a several day history of sinus congestion and sinus pressure with her right pinkeye treated by his ENT with tobramycin/dexamethasone eyedrops.  He denied cough or shortness of breath or chest pain.  Has no nausea vomiting no abdominal pain or dysuria.  States he had a single loose watery movement at around lunchtime on 09/30/2020.  Denies headache visual disturbance or neck pain.  He endorses generalized malaise, fatigue and decreased appetite.  In the ED, BP 171/110, pulse 115 temperature 100.8, respirations 20 with O2 sat 96% on room air.  Blood work significant for WBC 17,000, lactic acid 1.2 and procalcitonin 0.43. Creatinine 1.89. Chest x-ray: No acute cardiopulmonary disease. CT abdomen  pelvis renal stone study with no acute process. Patient started received IVF bolus and IV Rocephin and vancomycin.  Hospitalist consulted for admission.  Hospital course:   Febrile illness/SIRS Asplenic sepsis Patient presenting to the ED with fever, upper respiratory tract symptoms.  Temperature 100.8 with HR 115 SPO2 1% on room air.  Procalcitonin 0.43, WBC count 17.0 with a lactic acid of 1.2.  Chest x-ray unrevealing, CT abdomen/pelvis unrevealing.  No significant source found.  Patient was initially started on vancomycin and Zosyn.  Continue to have intermittent low-grade fevers.  Infectious disease was consulted, and believe his symptomatology consistent with asplenic sepsis, although his cultures were negative he had taken a dose of Augmentin in the past due to his fever at home and this could cause his cultures to be negative because of this.  Infectious disease recommended to continue antibiotics with Levaquin 500 mg p.o. daily to complete 10-day course.  Follow-up ID outpatient.   AKI with gross hematuria.   Creatinine was 1.1 in 2018.  Has not seen physician since then. History of having gross hematuria after getting a viral infection 5 years ago.  No history of recurrent sinus or upper respiratory infections.  Creatinine with mild improvement, at 1.83 today with bicarb of 22. 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 UA with significant hematuria, proteinuria and pyuria.  Urine cultures negative.  Nephrology was consulted and followed during hospital course.  ANCA/ASO/myeloperoxidase antibodies, C3-C4 negative or within normal limits.  Underwent renal biopsy on 10/04/2020.  Outpatient follow-up scheduled with nephrology, Dr. Thedore Mins on 10/08/2020.  Started on irbesartan 150 mg p.o. daily.  Acute blood loss anemia.   Most likely with hematuria  and renal dysfunction, other hematologic disorder cannot be ruled out at this time. Anemia panel with iron deficiency and normal TIBC, some element of  anemia of chronic disease.  Patient received 1 dose of IV Feraheme.  Continue iron supplementation outpatient.  CBC 1 week.  Leukocytosis.   Some improvement but WBC remained significantly elevated at 31K.  Pathology review of smear was without any significant abnormality.  WBC count 16.7 at time of discharge.  CBC 1 week following completion of antibiotics.  Hypertension.   Blood pressure remained elevated, no prior diagnosis of hypertension.  Started on irbesartan 150 mg p.o. daily, hydralazine 75 mg p.o. 3 times daily.  Outpatient follow-up with nephrology/PCP.  Hx OSA Patient with previous known history of obstructive sleep apnea, was previously on CPAP but patient had discarded his CPAP machine.  Currently follows with ENT and currently trying to arrange for new CPAP machine for home use.  Likely contributing factor to his poorly controlled hypertension.  Conjunctivitis Appears improved. Patient was on Tobradex as an outpatient.  Discharge Diagnoses:  Active Problems:   Sepsis (HCC)   History of splenectomy   Acute renal failure (HCC)   Elevated BP without diagnosis of hypertension   URI (upper respiratory infection)   History of cochlear implant    Discharge Instructions  Discharge Instructions    Call MD for:  difficulty breathing, headache or visual disturbances   Complete by: As directed    Call MD for:  extreme fatigue   Complete by: As directed    Call MD for:  persistant dizziness or light-headedness   Complete by: As directed    Call MD for:  persistant nausea and vomiting   Complete by: As directed    Call MD for:  severe uncontrolled pain   Complete by: As directed    Call MD for:  temperature >100.4   Complete by: As directed    Diet - low sodium heart healthy   Complete by: As directed    Increase activity slowly   Complete by: As directed    No wound care   Complete by: As directed      Allergies as of 10/05/2020      Reactions   Aspirin        Medication List    STOP taking these medications   ibuprofen 200 MG tablet Commonly known as: ADVIL     TAKE these medications   ferrous sulfate 325 (65 FE) MG tablet Take 1 tablet (325 mg total) by mouth daily with breakfast. Start taking on: October 06, 2020   hydrALAZINE 50 MG tablet Commonly known as: APRESOLINE Take 1.5 tablets (75 mg total) by mouth every 8 (eight) hours.   irbesartan 150 MG tablet Commonly known as: AVAPRO Take 1 tablet (150 mg total) by mouth daily. Start taking on: October 06, 2020   tobramycin-dexamethasone ophthalmic solution Commonly known as: TOBRADEX Place 2 drops into both eyes 2 (two) times daily. For 4 days       Follow-up Information    Mosetta Pigeon, MD. Go on 10/08/2020.   Specialty: Nephrology Why: 1:20pm Contact information: [address] [place] [address] [phone]        Maple Hudson., MD. Schedule an appointment as soon as possible for a visit in 1 week(s).   Specialty: Family Medicine Contact information: [address] [place] [address] [place] [address] [phone]              Allergies  Allergen Reactions  . Aspirin     Consultations:  Nephrology, Dr. Thedore MinsSingh  Infectious disease, Dr. Sampson GoonFitzgerald   Procedures/Studies: DG Chest 2 View  Result Date: 09/30/2020 CLINICAL DATA:  Congestion EXAM: CHEST - 2 VIEW COMPARISON:  Nov 20, 2016 FINDINGS: The heart size and mediastinal contours are within normal limits. Both lungs are clear. The visualized skeletal structures are unremarkable. IMPRESSION: No active cardiopulmonary disease. Electronically Signed   By: Katherine Mantlehristopher  Green M.D.   On: 09/30/2020 22:12   ECHOCARDIOGRAM COMPLETE  Result Date: 10/05/2020    ECHOCARDIOGRAM REPORT   Patient Name:   Jeremiah NettlesJACOB H Overbeck Date of Exam: 10/05/2020 Medical Rec #:  [id number]      Height:       71.0 in Accession #:    [id number]     Weight:       189.8 lb Date of Birth:  08/02/1980       BSA:           2.062 m Patient Age:    39 years       BP:           164/98 mmHg Patient Gender: M              HR:           72 bpm. Exam Location:  [facility] Procedure: 2D Echo, Cardiac Doppler and Color Doppler Indications:     Bacteremia R78.81  History:         Patient has no prior history of Echocardiogram examinations. No                  medical history on file.  Sonographer:     Cristela BlueJerry Hege RDCS (AE) Referring Phys:  [id number] DAVID P FITZGERALD Diagnosing Phys: Lorine BearsMuhammad Arida MD IMPRESSIONS  1. Left ventricular ejection fraction, by estimation, is 60 to 65%. The left ventricle has normal function. The left ventricle has no regional wall motion abnormalities. There is moderate left ventricular hypertrophy. Left ventricular diastolic parameters were normal.  2. Right ventricular systolic function is normal. The right ventricular size is normal. There is normal pulmonary artery systolic pressure.  3. Left atrial size was moderately dilated.  4. Right atrial size was mildly dilated.  5. The mitral valve is normal in structure. No evidence of mitral valve regurgitation. No evidence of mitral stenosis.  6. The aortic valve is normal in structure. Aortic valve regurgitation is not visualized. No aortic stenosis is present.  7. The inferior vena cava is normal in size with greater than 50% respiratory variability, suggesting right atrial pressure of 3 mmHg. Conclusion(s)/Recommendation(s): No evidence of valvular vegetations on this transthoracic echocardiogram. FINDINGS  Left Ventricle: Left ventricular ejection fraction, by estimation, is 60 to 65%. The left ventricle has normal function. The left ventricle has no regional wall motion abnormalities. The left ventricular internal cavity size was normal in size. There is  moderate left ventricular hypertrophy. Left ventricular diastolic parameters were normal. Right Ventricle: The right ventricular size is normal. No increase in right ventricular wall thickness. Right ventricular systolic  function is normal. There is normal pulmonary artery systolic pressure. The tricuspid regurgitant velocity is 2.12 m/s, and  with an assumed right atrial pressure of 3 mmHg, the estimated right ventricular systolic pressure is 21.0 mmHg. Left Atrium: Left atrial size was moderately dilated. Right Atrium: Right atrial size was mildly dilated. Pericardium: There is no evidence of pericardial effusion. Mitral Valve: The mitral valve is normal in structure. There  is mild thickening of the mitral valve leaflet(s). Mild mitral annular calcification. No evidence of mitral valve regurgitation. No evidence of mitral valve stenosis. Tricuspid Valve: The tricuspid valve is normal in structure. Tricuspid valve regurgitation is not demonstrated. No evidence of tricuspid stenosis. Aortic Valve: The aortic valve is normal in structure. Aortic valve regurgitation is not visualized. No aortic stenosis is present. Aortic valve mean gradient measures 4.5 mmHg. Aortic valve peak gradient measures 7.9 mmHg. Aortic valve area, by VTI measures 3.55 cm. Pulmonic Valve: The pulmonic valve was normal in structure. Pulmonic valve regurgitation is not visualized. No evidence of pulmonic stenosis. Aorta: The aortic root is normal in size and structure. Venous: The inferior vena cava is normal in size with greater than 50% respiratory variability, suggesting right atrial pressure of 3 mmHg. IAS/Shunts: No atrial level shunt detected by color flow Doppler.  LEFT VENTRICLE PLAX 2D LVIDd:         5.07 cm  Diastology LVIDs:         2.78 cm  LV e' medial:    9.25 cm/s LV PW:         1.47 cm  LV E/e' medial:  11.5 LV IVS:        1.07 cm  LV e' lateral:   10.30 cm/s LVOT diam:     2.10 cm  LV E/e' lateral: 10.3 LV SV:         101 LV SV Index:   49 LVOT Area:     3.46 cm  RIGHT VENTRICLE RV Basal diam:  3.60 cm RV S prime:     13.50 cm/s TAPSE (M-mode): 4.8 cm LEFT ATRIUM              Index       RIGHT ATRIUM           Index LA diam:        4.10 cm   1.99 cm/m  RA Area:     21.30 cm LA Vol (A2C):   132.0 ml 64.01 ml/m RA Volume:   65.10 ml  31.57 ml/m LA Vol (A4C):   99.9 ml  48.44 ml/m LA Biplane Vol: 117.0 ml 56.73 ml/m  AORTIC VALVE                   PULMONIC VALVE AV Area (Vmax):    3.28 cm    PV Vmax:        0.84 m/s AV Area (Vmean):   3.20 cm    PV Peak grad:   2.8 mmHg AV Area (VTI):     3.55 cm    RVOT Peak grad: 7 mmHg AV Vmax:           140.50 cm/s AV Vmean:          99.050 cm/s AV VTI:            0.286 m AV Peak Grad:      7.9 mmHg AV Mean Grad:      4.5 mmHg LVOT Vmax:         133.00 cm/s LVOT Vmean:        91.400 cm/s LVOT VTI:          0.293 m LVOT/AV VTI ratio: 1.02  AORTA Ao Root diam: 3.30 cm MITRAL VALVE                TRICUSPID VALVE MV Area (PHT): 3.85 cm     TR Peak grad:   18.0 mmHg  MV Decel Time: 197 msec     TR Vmax:        212.00 cm/s MV E velocity: 106.00 cm/s MV A velocity: 87.40 cm/s   SHUNTS MV E/A ratio:  1.21         Systemic VTI:  0.29 m                             Systemic Diam: 2.10 cm Lorine Bears MD Electronically signed by Lorine Bears MD Signature Date/Time: 10/05/2020/12:34:39 PM    Final    CT Renal Stone Study  Result Date: 10/01/2020 CLINICAL DATA:  Hematuria EXAM: CT ABDOMEN AND PELVIS WITHOUT CONTRAST TECHNIQUE: Multidetector CT imaging of the abdomen and pelvis was performed following the standard protocol without IV contrast. COMPARISON:  CT pelvis 08/07/2014 FINDINGS: Lower chest: The lung bases are clear. Hepatobiliary: No focal liver abnormality is seen. No gallstones, gallbladder wall thickening, or biliary dilatation. Pancreas: Unremarkable. No pancreatic ductal dilatation or surrounding inflammatory changes. 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 Adrenals/Urinary Tract: Adrenal glands are unremarkable. Kidneys are normal, without renal calculi, focal lesion, or hydronephrosis. Bladder is unremarkable. Stomach/Bowel: Stomach is within normal limits. Appendix appears  normal. No evidence of bowel wall thickening, distention, or inflammatory changes. Vascular/Lymphatic: No significant vascular findings are present. No enlarged abdominal or pelvic lymph nodes. Reproductive: Uterus and bilateral adnexa are unremarkable. Other: No abdominal wall hernia or abnormality. No abdominopelvic ascites. Musculoskeletal: No acute or significant osseous findings. IMPRESSION: No acute process demonstrated in the abdomen or pelvis. No renal or ureteral stone or obstruction. Absence of the spleen. Electronically Signed   By: Burman Nieves M.D.   On: 10/01/2020 00:59   US BIOPSY (KIDNEY)  Result Date: 10/04/2020 INDICATION: 40 year old male with a history hematuria and proteinuria EXAM: IMAGE GUIDED MEDICAL RENAL BIOPSY MEDICATIONS: None. ANESTHESIA/SEDATION: Moderate (conscious) sedation was employed during this procedure. A total of Versed 2.0 mg and Fentanyl 100 mcg was administered intravenously. Moderate Sedation Time: 10 minutes. The patient's level of consciousness and vital signs were monitored continuously by radiology nursing throughout the procedure under my direct supervision. FLUOROSCOPY TIME:  Ultrasound COMPLICATIONS: NONE PROCEDURE: Informed written consent was obtained from the patient after a thorough discussion of the procedural risks, benefits and alternatives. All questions were addressed. Maximal Sterile Barrier Technique was utilized including caps, mask, sterile gowns, sterile gloves, sterile drape, hand hygiene and skin antiseptic. A timeout was performed prior to the initiation of the procedure. Patient was positioned prone position on the gantry table. Images were stored sent to PACs. Once the patient is prepped and draped in the usual sterile fashion, the skin and subcutaneous tissues overlying the left kidney were generously infiltrated 1% lidocaine for local anesthesia. Using ultrasound guidance, a 17 gauge guide needle was advanced into the lower cortex of the  left kidney. Once we confirmed location of the needle tip, 4 separate 18 gauge core biopsy were achieved. Two Gel-Foam pledgets were infused with a small amount of saline. The needle was removed. Final images were stored. The patient tolerated the procedure well and remained hemodynamically stable throughout. No complications were encountered and no significant blood loss encountered. IMPRESSION: Status post image guided medical renal biopsy. Signed, Yvone Neu. Reyne Dumas, RPVI [facility] Radiology Electronically Signed   By: Gilmer Mor D.O.   On: 10/04/2020 13:33   CT TEMPORAL BONES WO CONTRAST  Result Date: 10/03/2020 CLINICAL  DATA:  Hearing loss status post cochlear implant. Fever of unknown origin. Headache. EXAM: CT TEMPORAL BONES WITHOUT CONTRAST TECHNIQUE: Axial and coronal plane CT imaging of the petrous temporal bones was performed with thin-collimation image reconstruction. No intravenous contrast was administered. Multiplanar CT image reconstructions were also generated. COMPARISON:  None. FINDINGS: RIGHT: The external auditory canal is unremarkable. The ossicles appear intact. The tympanic cavity is clear. The internal auditory canal, and semicircular canals are unremarkable. The cochlea is abnormal with a deficient modiolus and absent septation between the middle and apical turns. The vestibular aqueduct is enlarged. The vestibule is also mildly enlarged. The mastoid air cells are clear. LEFT: Sequelae of canal wall up mastoidectomy are identified. A cochlear implant is in place. There is streak artifact about the external portion of the implant without a gross fluid collection identified. As on the contralateral side, the vestibule and vestibular aqueduct are enlarged. The cochlea is also likely abnormal although the presence of the implant limits assessment. The tympanic cavity and residual mastoid air cells are clear. The ossicles appear intact.  The internal auditory canal and semicircular canals are unremarkable. There is mild scattered mucosal thickening in the paranasal sinuses there is a 1 cm bony defect in the posterior wall of the left maxillary sinus without inflammatory changes in the retro antral fat. The visualized portions of the orbits and brain are unremarkable. IMPRESSION: 1. Left cochlear implant in place. No fluid collection or other acute abnormality identified. 2. 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 Electronically Signed   By: Sebastian Ache M.D.   On: 10/03/2020 18:37      Subjective: Patient seen and examined at bedside, resting comfortably.  Family present.  Ready for discharge home.  No complaints at this time.  Denies headache, no fever/chills/night sweats, no nausea/vomiting/diarrhea, no chest pain, no palpitations, no shortness of breath, no abdominal pain, no weakness, fatigue, no paresthesias.  No acute events overnight per nursing staff.  Discharge Exam: Vitals:   10/05/20 0952 10/05/20 1212  BP: (!) 171/107 (!) 177/109  Pulse: 87 86  Resp: 18 18  Temp: (!) 97.5 F (36.4 C) 98 F (36.7 C)  SpO2: 99% 98%   Vitals:   10/05/20 0453 10/05/20 0514 10/05/20 0952 10/05/20 1212  BP: (!) 164/98  (!) 171/107 (!) 177/109  Pulse: 72  87 86  Resp: 16  18 18   Temp: 98.6 F (37 C)  (!) 97.5 F (36.4 C) 98 F (36.7 C)  TempSrc: Oral  Oral Oral  SpO2: 96%  99% 98%  Weight:  86.1 kg    Height:        General: Pt is alert, awake, not in acute distress Cardiovascular: RRR, S1/S2 +, no rubs, no gallops Respiratory: CTA bilaterally, no wheezing, no rhonchi, on room air Abdominal: Soft, NT, ND, bowel sounds + Extremities: no edema, no cyanosis    The results of significant diagnostics from this hospitalization (including imaging, microbiology, ancillary and laboratory) are listed below  for reference.     Microbiology: Recent Results (from the past 240 hour(s))  Urine Culture     Status: None   Collection Time: 09/30/20  9:25 PM   Specimen: Urine, Random  Result Value Ref Range Status   Specimen Description   Final    URINE, RANDOM Performed at [facility], [address] [place], [place] [address]    Special Requests   Final  NONE Performed at [facility], [address] [place], [place] [address]    Culture   Final    NO GROWTH Performed at [facility], [address] [place], [place] [address]    Report Status 10/02/2020 FINAL  Final  Resp Panel by RT-PCR (Flu A&B, Covid) Nasopharyngeal Swab     Status: None   Collection Time: 10/01/20 12:23 AM   Specimen: Nasopharyngeal Swab; Nasopharyngeal(NP) swabs in vial transport medium  Result Value Ref Range Status   SARS Coronavirus 2 by RT PCR NEGATIVE NEGATIVE Final    Comment: (NOTE) SARS-CoV-2 target nucleic acids are NOT DETECTED.  The SARS-CoV-2 RNA is generally detectable in upper respiratory specimens during the acute phase of infection. The lowest concentration of SARS-CoV-2 viral copies this assay can detect is 138 copies/mL. A negative result does not preclude SARS-Cov-2 infection and should not be used as the sole basis for treatment or other patient management decisions. A negative result may occur with  improper specimen collection/handling, submission of specimen other than nasopharyngeal swab, presence of viral mutation(s) within the areas targeted by this assay, and inadequate number of viral copies(<138 copies/mL). A negative result must be combined with clinical observations, patient history, and epidemiological information. The expected result is Negative.  Fact Sheet for Patients:  BloggerCourse.com  Fact Sheet for Healthcare Providers:  SeriousBroker.it  This test is no t yet approved or cleared by the  [place] FDA and  has been authorized for detection and/or diagnosis of SARS-CoV-2 by FDA under an Emergency Use Authorization (EUA). This EUA will remain  in effect (meaning this test can be used) for the duration of the COVID-19 declaration under Section 564(b)(1) of the Act, 21 U.S.C.section 360bbb-3(b)(1), unless the authorization is terminated  or revoked sooner.       Influenza A by PCR NEGATIVE NEGATIVE Final   Influenza B by PCR NEGATIVE NEGATIVE Final    Comment: (NOTE) The Xpert Xpress SARS-CoV-2/FLU/RSV plus assay is intended as an aid in the diagnosis of influenza from Nasopharyngeal swab specimens and should not be used as a sole basis for treatment. Nasal washings and aspirates are unacceptable for Xpert Xpress SARS-CoV-2/FLU/RSV testing.  Fact Sheet for Patients: BloggerCourse.com  Fact Sheet for Healthcare Providers: SeriousBroker.it  This test is not yet approved or cleared by the [place] FDA and has been authorized for detection and/or diagnosis of SARS-CoV-2 by FDA under an Emergency Use Authorization (EUA). This EUA will remain in effect (meaning this test can be used) for the duration of the COVID-19 declaration under Section 564(b)(1) of the Act, 21 U.S.C. section 360bbb-3(b)(1), unless the authorization is terminated or revoked.  Performed at [facility], [address] [place], [place] [address]   Blood culture (routine x 2)     Status: None (Preliminary result)   Collection Time: 10/01/20 12:36 AM   Specimen: BLOOD  Result Value Ref Range Status   Specimen Description BLOOD  RIGHT HAND  Final   Special Requests   Final    BOTTLES DRAWN AEROBIC AND ANAEROBIC Blood Culture adequate volume   Culture   Final    NO GROWTH 4 DAYS Performed at [facility], [address], [place], [place] [address]    Report Status PENDING  Incomplete  Blood culture (routine x 2)      Status: None (Preliminary result)   Collection Time: 10/01/20 12:57 AM   Specimen: BLOOD  Result Value Ref Range Status   Specimen Description  BLOOD LEFT AC  Final   Special Requests   Final    BOTTLES DRAWN AEROBIC AND ANAEROBIC Blood Culture adequate volume   Culture   Final    NO GROWTH 4 DAYS Performed at [facility], [address] [place], [place] [address]    Report Status PENDING  Incomplete  Gastrointestinal Panel by PCR , Stool     Status: None   Collection Time: 10/01/20 12:16 PM   Specimen: STOOL  Result Value Ref Range Status   Campylobacter species NOT DETECTED NOT DETECTED Final   Plesimonas shigelloides NOT DETECTED NOT DETECTED Final   Salmonella species NOT DETECTED NOT DETECTED Final   Yersinia enterocolitica NOT DETECTED NOT DETECTED Final   Vibrio species NOT DETECTED NOT DETECTED Final   Vibrio cholerae NOT DETECTED NOT DETECTED Final   Enteroaggregative E coli (EAEC) NOT DETECTED NOT DETECTED Final   Enteropathogenic E coli (EPEC) NOT DETECTED NOT DETECTED Final   Enterotoxigenic E coli (ETEC) NOT DETECTED NOT DETECTED Final   Shiga like toxin producing E coli (STEC) NOT DETECTED NOT DETECTED Final   Shigella/Enteroinvasive E coli (EIEC) NOT DETECTED NOT DETECTED Final   Cryptosporidium NOT DETECTED NOT DETECTED Final   Cyclospora cayetanensis NOT DETECTED NOT DETECTED Final   Entamoeba histolytica NOT DETECTED NOT DETECTED Final   Giardia lamblia NOT DETECTED NOT DETECTED Final   Adenovirus F40/41 NOT DETECTED NOT DETECTED Final   Astrovirus NOT DETECTED NOT DETECTED Final   Norovirus GI/GII NOT DETECTED NOT DETECTED Final   Rotavirus A NOT DETECTED NOT DETECTED Final   Sapovirus (I, II, IV, and V) NOT DETECTED NOT DETECTED Final    Comment: Performed at [facility], [address] [place], [place] [address]  C Difficile Quick Screen w PCR reflex     Status: None   Collection Time: 10/01/20 12:16 PM   Specimen: STOOL  Result  Value Ref Range Status   C Diff antigen NEGATIVE NEGATIVE Final   C Diff toxin NEGATIVE NEGATIVE Final   C Diff interpretation No C. difficile detected.  Final    Comment: Performed at [facility], [address] [place], [place] [address]  Resp Panel by RT-PCR (Flu A&B, Covid) Nasopharyngeal Swab     Status: None   Collection Time: 10/03/20  6:40 PM   Specimen: Nasopharyngeal Swab; Nasopharyngeal(NP) swabs in vial transport medium  Result Value Ref Range Status   SARS Coronavirus 2 by RT PCR NEGATIVE NEGATIVE Final    Comment: (NOTE) SARS-CoV-2 target nucleic acids are NOT DETECTED.  The SARS-CoV-2 RNA is generally detectable in upper respiratory specimens during the acute phase of infection. The lowest concentration of SARS-CoV-2 viral copies this assay can detect is 138 copies/mL. A negative result does not preclude SARS-Cov-2 infection and should not be used as the sole basis for treatment or other patient management decisions. A negative result may occur with  improper specimen collection/handling, submission of specimen other than nasopharyngeal swab, presence of viral mutation(s) within the areas targeted by this assay, and inadequate number of viral copies(<138 copies/mL). A negative result must be combined with clinical observations, patient history, and epidemiological information. The expected result is Negative.  Fact Sheet for Patients:  BloggerCourse.com  Fact Sheet for Healthcare Providers:  SeriousBroker.it  This test is no t yet approved or cleared by the [place] FDA and  has been authorized for detection and/or diagnosis of SARS-CoV-2 by FDA under an Emergency Use Authorization (EUA). This EUA will remain  in effect (meaning this test can be used) for the duration of the COVID-19 declaration under Section 564(b)(1) of the Act, 21 U.S.C.section 360bbb-3(b)(1), unless the authorization is  terminated  or revoked sooner.       Influenza A by PCR NEGATIVE NEGATIVE Final   Influenza B by PCR NEGATIVE NEGATIVE Final    Comment: (NOTE) The Xpert Xpress SARS-CoV-2/FLU/RSV plus assay is intended as an aid in the diagnosis of influenza from Nasopharyngeal swab specimens and should not be used as a sole basis for treatment. Nasal washings and aspirates are unacceptable for Xpert Xpress SARS-CoV-2/FLU/RSV testing.  Fact Sheet for Patients: BloggerCourse.com  Fact Sheet for Healthcare Providers: SeriousBroker.it  This test is not yet approved or cleared by the [place] FDA and has been authorized for detection and/or diagnosis of SARS-CoV-2 by FDA under an Emergency Use Authorization (EUA). This EUA will remain in effect (meaning this test can be used) for the duration of the COVID-19 declaration under Section 564(b)(1) of the Act, 21 U.S.C. section 360bbb-3(b)(1), unless the authorization is terminated or revoked.  Performed at [facility], [address] [place], [place] [address]      Labs: BNP (last 3 results) No results for input(s): BNP in the last 8760 hours. Basic Metabolic Panel: Recent Labs  Lab 10/01/20 1204 10/02/20 0537 10/03/20 0502 10/04/20 0554 10/05/20 0453  NA 138 139 137 138 138  K 4.4 4.1 3.5 3.4* 3.4*  CL 106 110 111 108 108  CO2 22 22 19* 20* 20*  GLUCOSE 134* 99 89 87 87  BUN 31* 32* 24* 22* 21*  CREATININE 2.43* 1.99* 1.93* 1.83* 1.94*  CALCIUM 7.5* 7.9* 7.7* 8.0* 8.4*   Liver Function Tests: Recent Labs  Lab 09/30/20 2127  AST 19  ALT 13  ALKPHOS 99  BILITOT 0.9  PROT 7.1  ALBUMIN 3.3*   No results for input(s): LIPASE, AMYLASE in the last 168 hours. No results for input(s): AMMONIA in the last 168 hours. CBC: Recent Labs  Lab 09/30/20 2127 10/01/20 0507 10/02/20 0537 10/03/20 0502 10/04/20 0554 10/05/20 0453  WBC 17.0* 31.8* 28.3* 23.8* 22.7* 16.7*   NEUTROABS 15.7*  --   --   --   --   --   HGB 12.0* 11.0* 9.7* 9.5* 9.7* 9.9*  HCT 33.9* 31.5* 28.5* 27.4* 27.3* 28.1*  MCV 82.3 82.7 84.3 83.3 81.7 81.0  PLT 302 224 194 187 191 199   Cardiac Enzymes: No results for input(s): CKTOTAL, CKMB, CKMBINDEX, TROPONINI in the last 168 hours. BNP: Invalid input(s): POCBNP CBG: No results for input(s): GLUCAP in the last 168 hours. D-Dimer No results for input(s): DDIMER in the last 72 hours. Hgb A1c No results for input(s): HGBA1C in the last 72 hours. Lipid Profile No results for input(s): CHOL, HDL, LDLCALC, TRIG, CHOLHDL, LDLDIRECT in the last 72 hours. Thyroid function studies No results for input(s): TSH, T4TOTAL, T3FREE, THYROIDAB in the last 72 hours.  Invalid input(s): FREET3 Anemia work up Recent Labs    10/03/20 1706  VITAMINB12 186  FOLATE 9.5  FERRITIN 346*  TIBC 267  IRON 12*  RETICCTPCT 2.5   Urinalysis    Component Value Date/Time   COLORURINE STRAW (A) 10/03/2020 1415   APPEARANCEUR CLEAR (A) 10/03/2020 1415   LABSPEC 1.003 (L) 10/03/2020 1415   PHURINE 5.0 10/03/2020 1415   GLUCOSEU NEGATIVE 10/03/2020 1415   HGBUR LARGE (A) 10/03/2020 1415   BILIRUBINUR NEGATIVE 10/03/2020 1415   BILIRUBINUR neg 02/24/2017 1657  KETONESUR NEGATIVE 10/03/2020 1415   PROTEINUR 100 (A) 10/03/2020 1415   UROBILINOGEN 0.2 02/24/2017 1657   NITRITE NEGATIVE 10/03/2020 1415   LEUKOCYTESUR TRACE (A) 10/03/2020 1415   Sepsis Labs Invalid input(s): PROCALCITONIN,  WBC,  LACTICIDVEN Microbiology Recent Results (from the past 240 hour(s))  Urine Culture     Status: None   Collection Time: 09/30/20  9:25 PM   Specimen: Urine, Random  Result Value Ref Range Status   Specimen Description   Final    URINE, RANDOM Performed at [facility], [address] [place], [place] [address]    Special Requests   Final    NONE Performed at [facility], [address], [place], [place] [address]    Culture    Final    NO GROWTH Performed at [facility], [address] [place], [place] [address]    Report Status 10/02/2020 FINAL  Final  Resp Panel by RT-PCR (Flu A&B, Covid) Nasopharyngeal Swab     Status: None   Collection Time: 10/01/20 12:23 AM   Specimen: Nasopharyngeal Swab; Nasopharyngeal(NP) swabs in vial transport medium  Result Value Ref Range Status   SARS Coronavirus 2 by RT PCR NEGATIVE NEGATIVE Final    Comment: (NOTE) SARS-CoV-2 target nucleic acids are NOT DETECTED.  The SARS-CoV-2 RNA is generally detectable in upper respiratory specimens during the acute phase of infection. The lowest concentration of SARS-CoV-2 viral copies this assay can detect is 138 copies/mL. A negative result does not preclude SARS-Cov-2 infection and should not be used as the sole basis for treatment or other patient management decisions. A negative result may occur with  improper specimen collection/handling, submission of specimen other than nasopharyngeal swab, presence of viral mutation(s) within the areas targeted by this assay, and inadequate number of viral copies(<138 copies/mL). A negative result must be combined with clinical observations, patient history, and epidemiological information. The expected result is Negative.  Fact Sheet for Patients:  BloggerCourse.com  Fact Sheet for Healthcare Providers:  SeriousBroker.it  This test is no t yet approved or cleared by the [place] FDA and  has been authorized for detection and/or diagnosis of SARS-CoV-2 by FDA under an Emergency Use Authorization (EUA). This EUA will remain  in effect (meaning this test can be used) for the duration of the COVID-19 declaration under Section 564(b)(1) of the Act, 21 U.S.C.section 360bbb-3(b)(1), unless the authorization is terminated  or revoked sooner.       Influenza A by PCR NEGATIVE NEGATIVE Final   Influenza B by PCR NEGATIVE NEGATIVE  Final    Comment: (NOTE) The Xpert Xpress SARS-CoV-2/FLU/RSV plus assay is intended as an aid in the diagnosis of influenza from Nasopharyngeal swab specimens and should not be used as a sole basis for treatment. Nasal washings and aspirates are unacceptable for Xpert Xpress SARS-CoV-2/FLU/RSV testing.  Fact Sheet for Patients: BloggerCourse.com  Fact Sheet for Healthcare Providers: SeriousBroker.it  This test is not yet approved or cleared by the [place] FDA and has been authorized for detection and/or diagnosis of SARS-CoV-2 by FDA under an Emergency Use Authorization (EUA). This EUA will remain in effect (meaning this test can be used) for the duration of the COVID-19 declaration under Section 564(b)(1) of the Act, 21 U.S.C. section 360bbb-3(b)(1), unless the authorization is terminated or revoked.  Performed at [facility], [address] [place], [place] [address]   Blood culture (routine x 2)     Status: None (Preliminary result)  Collection Time: 10/01/20 12:36 AM   Specimen: BLOOD  Result Value Ref Range Status   Specimen Description BLOOD  RIGHT HAND  Final   Special Requests   Final    BOTTLES DRAWN AEROBIC AND ANAEROBIC Blood Culture adequate volume   Culture   Final    NO GROWTH 4 DAYS Performed at [facility], [address] [place], [place] [address]    Report Status PENDING  Incomplete  Blood culture (routine x 2)     Status: None (Preliminary result)   Collection Time: 10/01/20 12:57 AM   Specimen: BLOOD  Result Value Ref Range Status   Specimen Description BLOOD LEFT AC  Final   Special Requests   Final    BOTTLES DRAWN AEROBIC AND ANAEROBIC Blood Culture adequate volume   Culture   Final    NO GROWTH 4 DAYS Performed at [facility], [address], [place], [place] [address]    Report Status PENDING  Incomplete  Gastrointestinal Panel by PCR , Stool     Status:  None   Collection Time: 10/01/20 12:16 PM   Specimen: STOOL  Result Value Ref Range Status   Campylobacter species NOT DETECTED NOT DETECTED Final   Plesimonas shigelloides NOT DETECTED NOT DETECTED Final   Salmonella species NOT DETECTED NOT DETECTED Final   Yersinia enterocolitica NOT DETECTED NOT DETECTED Final   Vibrio species NOT DETECTED NOT DETECTED Final   Vibrio cholerae NOT DETECTED NOT DETECTED Final   Enteroaggregative E coli (EAEC) NOT DETECTED NOT DETECTED Final   Enteropathogenic E coli (EPEC) NOT DETECTED NOT DETECTED Final   Enterotoxigenic E coli (ETEC) NOT DETECTED NOT DETECTED Final   Shiga like toxin producing E coli (STEC) NOT DETECTED NOT DETECTED Final   Shigella/Enteroinvasive E coli (EIEC) NOT DETECTED NOT DETECTED Final   Cryptosporidium NOT DETECTED NOT DETECTED Final   Cyclospora cayetanensis NOT DETECTED NOT DETECTED Final   Entamoeba histolytica NOT DETECTED NOT DETECTED Final   Giardia lamblia NOT DETECTED NOT DETECTED Final   Adenovirus F40/41 NOT DETECTED NOT DETECTED Final   Astrovirus NOT DETECTED NOT DETECTED Final   Norovirus GI/GII NOT DETECTED NOT DETECTED Final   Rotavirus A NOT DETECTED NOT DETECTED Final   Sapovirus (I, II, IV, and V) NOT DETECTED NOT DETECTED Final    Comment: Performed at [facility], [address] [place], [place] [address]  C Difficile Quick Screen w PCR reflex     Status: None   Collection Time: 10/01/20 12:16 PM   Specimen: STOOL  Result Value Ref Range Status   C Diff antigen NEGATIVE NEGATIVE Final   C Diff toxin NEGATIVE NEGATIVE Final   C Diff interpretation No C. difficile detected.  Final    Comment: Performed at [facility], [address] [place], [place] [address]  Resp Panel by RT-PCR (Flu A&B, Covid) Nasopharyngeal Swab     Status: None   Collection Time: 10/03/20  6:40 PM   Specimen: Nasopharyngeal Swab; Nasopharyngeal(NP) swabs in vial transport medium  Result Value Ref Range  Status   SARS Coronavirus 2 by RT PCR NEGATIVE NEGATIVE Final    Comment: (NOTE) SARS-CoV-2 target nucleic acids are NOT DETECTED.  The SARS-CoV-2 RNA is generally detectable in upper respiratory specimens during the acute phase of infection. The lowest concentration of SARS-CoV-2 viral copies this assay can detect is 138 copies/mL. A negative result does not preclude SARS-Cov-2 infection and should not be used as the sole basis for treatment  or other patient management decisions. A negative result may occur with  improper specimen collection/handling, submission of specimen other than nasopharyngeal swab, presence of viral mutation(s) within the areas targeted by this assay, and inadequate number of viral copies(<138 copies/mL). A negative result must be combined with clinical observations, patient history, and epidemiological information. The expected result is Negative.  Fact Sheet for Patients:  BloggerCourse.com  Fact Sheet for Healthcare Providers:  SeriousBroker.it  This test is no t yet approved or cleared by the [place] FDA and  has been authorized for detection and/or diagnosis of SARS-CoV-2 by FDA under an Emergency Use Authorization (EUA). This EUA will remain  in effect (meaning this test can be used) for the duration of the COVID-19 declaration under Section 564(b)(1) of the Act, 21 U.S.C.section 360bbb-3(b)(1), unless the authorization is terminated  or revoked sooner.       Influenza A by PCR NEGATIVE NEGATIVE Final   Influenza B by PCR NEGATIVE NEGATIVE Final    Comment: (NOTE) The Xpert Xpress SARS-CoV-2/FLU/RSV plus assay is intended as an aid in the diagnosis of influenza from Nasopharyngeal swab specimens and should not be used as a sole basis for treatment. Nasal washings and aspirates are unacceptable for Xpert Xpress SARS-CoV-2/FLU/RSV testing.  Fact Sheet for  Patients: BloggerCourse.com  Fact Sheet for Healthcare Providers: SeriousBroker.it  This test is not yet approved or cleared by the [place] FDA and has been authorized for detection and/or diagnosis of SARS-CoV-2 by FDA under an Emergency Use Authorization (EUA). This EUA will remain in effect (meaning this test can be used) for the duration of the COVID-19 declaration under Section 564(b)(1) of the Act, 21 U.S.C. section 360bbb-3(b)(1), unless the authorization is terminated or revoked.  Performed at [facility], [address], [place], [place] [address]      Time coordinating discharge: Over 30 minutes  SIGNED:   Alvira Philips [place], DO  [facility] 10/05/2020, 1:09 PM

## 2020-10-05 NOTE — Progress Notes (Signed)
[facility] CLINIC INFECTIOUS DISEASE PROGRESS NOTE Date of Admission:  09/30/2020     ID: Jeremiah [name] is a 40 y.o. male with  Asplenic sepsis Active Problems:   Sepsis (HCC)   History of splenectomy   Acute renal failure (HCC)   Elevated BP without diagnosis of hypertension   URI (upper respiratory infection)   History of cochlear implant   Subjective: No fevers, wbc decreasing. Feels much better. No HA, Cough SOB.   ROS  Eleven systems are reviewed and negative except per hpi  Medications:  Antibiotics Given (last 72 hours)    Date/Time Action Medication Dose Rate   10/02/20 2309 New Bag/Given  [cluster care.]   cefTRIAXone (ROCEPHIN) 1 g in sodium chloride 0.9 % 100 mL IVPB 1 g 200 mL/hr   10/03/20 0403 New Bag/Given   vancomycin (VANCOREADY) IVPB 1250 mg/250 mL 1,250 mg 166.7 mL/hr   10/03/20 2328 New Bag/Given   cefTRIAXone (ROCEPHIN) 1 g in sodium chloride 0.9 % 100 mL IVPB 1 g 200 mL/hr   10/05/20 0016 New Bag/Given   cefTRIAXone (ROCEPHIN) 1 g in sodium chloride 0.9 % 100 mL IVPB 1 g 200 mL/hr     . ferrous sulfate  325 mg Oral Q breakfast  . hydrALAZINE  50 mg Oral Q8H  . irbesartan  150 mg Oral Daily  . melatonin  5 mg Oral QHS    Objective: Vital signs in last 24 hours: Temp:  [97.5 F (36.4 C)-98.6 F (37 C)] 97.5 F (36.4 C) (04/08 0952) Pulse Rate:  [72-104] 87 (04/08 0952) Resp:  [16-20] 18 (04/08 0952) BP: (136-171)/(86-107) 171/107 (04/08 0952) SpO2:  [94 %-99 %] 99 % (04/08 0952) Weight:  [86.1 kg] 86.1 kg (04/08 0514) Constitutional: He is oriented to person, place, and time. He appears well-developed and well-nourished. No distress.  HENT: anicteric Mouth/Throat: Oropharynx is clear and moist. No oropharyngeal exudate.  Cardiovascular: Normal rate, regular rhythm and normal heart sounds. Exam reveals no gallop and no friction rub.  No murmur heard.  Pulmonary/Chest: Effort normal and breath sounds normal. No respiratory distress. He has no  wheezes.  Abdominal: Soft. Bowel sounds are normal. He exhibits no distension. There is no tenderness.  Lymphadenopathy:  He has no cervical adenopathy.  Neurological: He is alert and oriented to person, place, and time.  Skin: Skin is warm and dry. No rash noted. No erythema.  Psychiatric: He has a normal mood and affect. His behavior is normal.    Lab Results Recent Labs    10/04/20 0554 10/05/20 0453  WBC 22.7* 16.7*  HGB 9.7* 9.9*  HCT 27.3* 28.1*  NA 138 138  K 3.4* 3.4*  CL 108 108  CO2 20* 20*  BUN 22* 21*  CREATININE 1.83* 1.94*    Microbiology: Results for orders placed or performed during the hospital encounter of 09/30/20  Urine Culture     Status: None   Collection Time: 09/30/20  9:25 PM   Specimen: Urine, Random  Result Value Ref Range Status   Specimen Description   Final    URINE, RANDOM Performed at [facility], [address], [place], [place] [address]    Special Requests   Final    NONE Performed at [facility], [address], [place], [place] [address]    Culture   Final    NO GROWTH Performed at [facility], [address], [place], [place] [address]    Report Status 10/02/2020 FINAL  Final  Resp Panel by RT-PCR (Flu A&B, Covid) Nasopharyngeal Swab     Status: None   Collection Time: 10/01/20 12:23 AM   Specimen: Nasopharyngeal Swab; Nasopharyngeal(NP) swabs in vial transport medium  Result Value Ref Range Status   SARS Coronavirus 2 by RT PCR NEGATIVE NEGATIVE Final    Comment: (NOTE) SARS-CoV-2 target nucleic acids are NOT DETECTED.  The SARS-CoV-2 RNA is generally detectable in upper respiratory specimens during the acute phase of infection. The lowest concentration of SARS-CoV-2 viral copies this assay can detect is 138 copies/mL. A negative result does not preclude SARS-Cov-2 infection and should not be used as the sole basis for treatment or other patient management decisions. A negative result may  occur with  improper specimen collection/handling, submission of specimen other than nasopharyngeal swab, presence of viral mutation(s) within the areas targeted by this assay, and inadequate number of viral copies(<138 copies/mL). A negative result must be combined with clinical observations, patient history, and epidemiological information. The expected result is Negative.  Fact Sheet for Patients:  BloggerCourse.com  Fact Sheet for Healthcare Providers:  SeriousBroker.it  This test is no t yet approved or cleared by the [place] FDA and  has been authorized for detection and/or diagnosis of SARS-CoV-2 by FDA under an Emergency Use Authorization (EUA). This EUA will remain  in effect (meaning this test can be used) for the duration of the COVID-19 declaration under Section 564(b)(1) of the Act, 21 U.S.C.section 360bbb-3(b)(1), unless the authorization is terminated  or revoked sooner.       Influenza A by PCR NEGATIVE NEGATIVE Final   Influenza B by PCR NEGATIVE NEGATIVE Final    Comment: (NOTE) The Xpert Xpress SARS-CoV-2/FLU/RSV plus assay is intended as an aid in the diagnosis of influenza from Nasopharyngeal swab specimens and should not be used as a sole basis for treatment. Nasal washings and aspirates are unacceptable for Xpert Xpress SARS-CoV-2/FLU/RSV testing.  Fact Sheet for Patients: BloggerCourse.com  Fact Sheet for Healthcare Providers: SeriousBroker.it  This test is not yet approved or cleared by the [place] FDA and has been authorized for detection and/or diagnosis of SARS-CoV-2 by FDA under an Emergency Use Authorization (EUA). This EUA will remain in effect (meaning this test can be used) for the duration of the COVID-19 declaration under Section 564(b)(1) of the Act, 21 U.S.C. section 360bbb-3(b)(1), unless the authorization is terminated  or revoked.  Performed at [facility], [address] [place], [place] [address]   Blood culture (routine x 2)     Status: None (Preliminary result)   Collection Time: 10/01/20 12:36 AM   Specimen: BLOOD  Result Value Ref Range Status   Specimen Description BLOOD  RIGHT HAND  Final   Special Requests   Final    BOTTLES DRAWN AEROBIC AND ANAEROBIC Blood Culture adequate volume   Culture   Final    NO GROWTH 4 DAYS Performed at [facility], [address], [place], [place] [address]    Report Status PENDING  Incomplete  Blood culture (routine x 2)     Status: None (Preliminary result)   Collection Time: 10/01/20 12:57 AM   Specimen: BLOOD  Result Value Ref Range Status   Specimen Description BLOOD LEFT [facility]  Final   Special Requests   Final    BOTTLES DRAWN AEROBIC AND ANAEROBIC Blood Culture adequate volume   Culture   Final    NO GROWTH 4 DAYS Performed at [facility], [address], [place], [place]  [address]    Report Status PENDING  Incomplete  Gastrointestinal Panel by PCR , Stool     Status: None   Collection Time: 10/01/20 12:16 PM   Specimen: STOOL  Result Value Ref Range Status   Campylobacter species NOT DETECTED NOT DETECTED Final   Plesimonas shigelloides NOT DETECTED NOT DETECTED Final   Salmonella species NOT DETECTED NOT DETECTED Final   Yersinia enterocolitica NOT DETECTED NOT DETECTED Final   Vibrio species NOT DETECTED NOT DETECTED Final   Vibrio cholerae NOT DETECTED NOT DETECTED Final   Enteroaggregative E coli (EAEC) NOT DETECTED NOT DETECTED Final   Enteropathogenic E coli (EPEC) NOT DETECTED NOT DETECTED Final   Enterotoxigenic E coli (ETEC) NOT DETECTED NOT DETECTED Final   Shiga like toxin producing E coli (STEC) NOT DETECTED NOT DETECTED Final   Shigella/Enteroinvasive E coli (EIEC) NOT DETECTED NOT DETECTED Final   Cryptosporidium NOT DETECTED NOT DETECTED Final   Cyclospora cayetanensis NOT DETECTED NOT DETECTED  Final   Entamoeba histolytica NOT DETECTED NOT DETECTED Final   Giardia lamblia NOT DETECTED NOT DETECTED Final   Adenovirus F40/41 NOT DETECTED NOT DETECTED Final   Astrovirus NOT DETECTED NOT DETECTED Final   Norovirus GI/GII NOT DETECTED NOT DETECTED Final   Rotavirus A NOT DETECTED NOT DETECTED Final   Sapovirus (I, II, IV, and V) NOT DETECTED NOT DETECTED Final    Comment: Performed at [facility], [address] [place], [place] [address]  C Difficile Quick Screen w PCR reflex     Status: None   Collection Time: 10/01/20 12:16 PM   Specimen: STOOL  Result Value Ref Range Status   C Diff antigen NEGATIVE NEGATIVE Final   C Diff toxin NEGATIVE NEGATIVE Final   C Diff interpretation No C. difficile detected.  Final    Comment: Performed at [facility], [address] [place], [place] [address]  Resp Panel by RT-PCR (Flu A&B, Covid) Nasopharyngeal Swab     Status: None   Collection Time: 10/03/20  6:40 PM   Specimen: Nasopharyngeal Swab; Nasopharyngeal(NP) swabs in vial transport medium  Result Value Ref Range Status   SARS Coronavirus 2 by RT PCR NEGATIVE NEGATIVE Final    Comment: (NOTE) SARS-CoV-2 target nucleic acids are NOT DETECTED.  The SARS-CoV-2 RNA is generally detectable in upper respiratory specimens during the acute phase of infection. The lowest concentration of SARS-CoV-2 viral copies this assay can detect is 138 copies/mL. A negative result does not preclude SARS-Cov-2 infection and should not be used as the sole basis for treatment or other patient management decisions. A negative result may occur with  improper specimen collection/handling, submission of specimen other than nasopharyngeal swab, presence of viral mutation(s) within the areas targeted by this assay, and inadequate number of viral copies(<138 copies/mL). A negative result must be combined with clinical observations, patient history, and epidemiological information. The  expected result is Negative.  Fact Sheet for Patients:  BloggerCourse.com  Fact Sheet for Healthcare Providers:  SeriousBroker.it  This test is no t yet approved or cleared by the [place] FDA and  has been authorized for detection and/or diagnosis of SARS-CoV-2 by FDA under an Emergency Use Authorization (EUA). This EUA will remain  in effect (meaning this test can be used) for the duration of the COVID-19 declaration under Section 564(b)(1) of the Act, 21 U.S.C.section 360bbb-3(b)(1), unless the authorization is terminated  or revoked sooner.       Influenza A by PCR NEGATIVE NEGATIVE Final  Influenza B by PCR NEGATIVE NEGATIVE Final    Comment: (NOTE) The Xpert Xpress SARS-CoV-2/FLU/RSV plus assay is intended as an aid in the diagnosis of influenza from Nasopharyngeal swab specimens and should not be used as a sole basis for treatment. Nasal washings and aspirates are unacceptable for Xpert Xpress SARS-CoV-2/FLU/RSV testing.  Fact Sheet for Patients: BloggerCourse.comhttps://www.fda.gov/media/152166/download  Fact Sheet for Healthcare Providers: SeriousBroker.ithttps://www.fda.gov/media/152162/download  This test is not yet approved or cleared by the [place] FDA and has been authorized for detection and/or diagnosis of SARS-CoV-2 by FDA under an Emergency Use Authorization (EUA). This EUA will remain in effect (meaning this test can be used) for the duration of the COVID-19 declaration under Section 564(b)(1) of the Act, 21 U.S.C. section 360bbb-3(b)(1), unless the authorization is terminated or revoked.  Performed at [facility], [address] [place], [place] [id number]     Studies/Results: US BIOPSY (KIDNEY)  Result Date: 10/04/2020 INDICATION: 40 year old male with a history hematuria and proteinuria EXAM: IMAGE GUIDED MEDICAL RENAL BIOPSY MEDICATIONS: None. ANESTHESIA/SEDATION: Moderate (conscious) sedation was employed  during this procedure. A total of Versed 2.0 mg and Fentanyl 100 mcg was administered intravenously. Moderate Sedation Time: 10 minutes. The patient's level of consciousness and vital signs were monitored continuously by radiology nursing throughout the procedure under my direct supervision. FLUOROSCOPY TIME:  Ultrasound COMPLICATIONS: NONE PROCEDURE: Informed written consent was obtained from the patient after a thorough discussion of the procedural risks, benefits and alternatives. All questions were addressed. Maximal Sterile Barrier Technique was utilized including caps, mask, sterile gowns, sterile gloves, sterile drape, hand hygiene and skin antiseptic. A timeout was performed prior to the initiation of the procedure. Patient was positioned prone position on the gantry table. Images were stored sent to PACs. Once the patient is prepped and draped in the usual sterile fashion, the skin and subcutaneous tissues overlying the left kidney were generously infiltrated 1% lidocaine for local anesthesia. Using ultrasound guidance, a 17 gauge guide needle was advanced into the lower cortex of the left kidney. Once we confirmed location of the needle tip, 4 separate 18 gauge core biopsy were achieved. Two Gel-Foam pledgets were infused with a small amount of saline. The needle was removed. Final images were stored. The patient tolerated the procedure well and remained hemodynamically stable throughout. No complications were encountered and no significant blood loss encountered. IMPRESSION: Status post image guided medical renal biopsy. Signed, Yvone NeuJaime S. Reyne DumasWagner, DO, RPVI [facility] Electronically Signed   By: Gilmer MorJaime  Wagner D.O.   On: 10/04/2020 13:33   CT TEMPORAL BONES WO CONTRAST  Result Date: 10/03/2020 CLINICAL DATA:  Hearing loss status post cochlear implant. Fever of unknown origin. Headache. EXAM: CT TEMPORAL BONES WITHOUT CONTRAST TECHNIQUE: Axial and  coronal plane CT imaging of the petrous temporal bones was performed with thin-collimation image reconstruction. No intravenous contrast was administered. Multiplanar CT image reconstructions were also generated. COMPARISON:  None. FINDINGS: RIGHT: The external auditory canal is unremarkable. The ossicles appear intact. The tympanic cavity is clear. The internal auditory canal, and semicircular canals are unremarkable. The cochlea is abnormal with a deficient modiolus and absent septation between the middle and apical turns. The vestibular aqueduct is enlarged. The vestibule is also mildly enlarged. The mastoid air cells are clear. LEFT: Sequelae of canal wall up mastoidectomy are identified. A cochlear implant is in place. There is streak artifact about the external portion of the implant without a gross fluid collection identified. As on the  contralateral side, the vestibule and vestibular aqueduct are enlarged. The cochlea is also likely abnormal although the presence of the implant limits assessment. The tympanic cavity and residual mastoid air cells are clear. The ossicles appear intact. The internal auditory canal and semicircular canals are unremarkable. There is mild scattered mucosal thickening in the paranasal sinuses there is a 1 cm bony defect in the posterior wall of the left maxillary sinus without inflammatory changes in the retro antral fat. The visualized portions of the orbits and brain are unremarkable. IMPRESSION: 1. Left cochlear implant in place. No fluid collection or other acute abnormality identified. 2. 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 Electronically Signed   By: Sebastian Ache M.D.   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  He has chronic headaches and these have been bothering him a little more recently but no other focal findings.  CT of the Templer bone is negative for any evidence of infection with the cochlear implant.  His symptomology is consistent with a splenic sepsis.  He fortunately had taken a dose of Augmentin as instructed in the past at the first onset of fever so his cultures may be negative because of that.  Recommendations BCX remains neg Neg for endocarditis on echocardiogram. He can be discharged with levofloxacin to complete a total of 10 days of treatment for asplenic sepsis. I can see in outpatient follow-up.   If fever recurs or worsening HA would consider LP. Will need to review his vaccination record and update as otpt given asplenic status.   Mick Sell   10/05/2020, 11:22 AM

## 2020-10-05 NOTE — Discharge Instructions (Addendum)
Please seek medical attention for any high fevers, chest pain, shortness of breath, change in behavior, persistent vomiting, bloody stool or any other new or concerning symptoms.  

## 2020-10-05 NOTE — ED Triage Notes (Signed)
Patient discharged earlier today from [facility] for kidney infection. Was called back by MD after abnormal test results came back for IV treatment

## 2020-10-05 NOTE — Progress Notes (Addendum)
[facility], [place] 10/05/20  Subjective:   Hospital day # 4  Patient underwent kidney biopsy yesterday Parents are in the room Soreness from biopsy, denies blood in urine Tolerating meals     04/07 0701 - 04/08 0700 In: 536.5 [P.O.:120; IV Piggyback:416.5] Out: 1025 [Urine:1025] Lab Results  Component Value Date   CREATININE 1.94 (H) 10/05/2020   CREATININE 1.83 (H) 10/04/2020   CREATININE 1.93 (H) 10/03/2020     Objective:  Vital signs in last 24 hours:  Temp:  [97.5 F (36.4 C)-98.6 F (37 C)] 97.5 F (36.4 C) (04/08 0952) Pulse Rate:  [72-104] 87 (04/08 0952) Resp:  [14-20] 18 (04/08 0952) BP: (136-171)/(86-107) 171/107 (04/08 0952) SpO2:  [94 %-100 %] 99 % (04/08 0952) Weight:  [86.1 kg] 86.1 kg (04/08 0514)  Weight change: -1.5 kg Filed Weights   10/03/20 0601 10/04/20 0500 10/05/20 0514  Weight: 87.2 kg 87.6 kg 86.1 kg    Intake/Output:    Intake/Output Summary (Last 24 hours) at 10/05/2020 1032 Last data filed at 10/05/2020 1013 Gross per 24 hour  Intake 536.51 ml  Output 800 ml  Net -263.49 ml    Physical Exam: General:  No acute distress, sitting in chair  HEENT  anicteric, moist oral mucous membrane, hearing loss, implant  Pulm/lungs  normal breathing effort, lungs are clear to auscultation  CVS/Heart  regular rhythm, no rub or gallop  Abdomen:   Soft, nontender  Extremities:  No peripheral edema  Neurologic:  Alert, oriented, able to follow commands  Skin:  No acute rashes       Basic Metabolic Panel:  Recent Labs  Lab 10/01/20 1204 10/02/20 0537 10/03/20 0502 10/04/20 0554 10/05/20 0453  NA 138 139 137 138 138  K 4.4 4.1 3.5 3.4* 3.4*  CL 106 110 111 108 108  CO2 22 22 19* 20* 20*  GLUCOSE 134* 99 89 87 87  BUN 31* 32* 24* 22* 21*  CREATININE 2.43* 1.99* 1.93* 1.83* 1.94*  CALCIUM 7.5* 7.9* 7.7* 8.0* 8.4*     CBC: Recent Labs  Lab 09/30/20 2127 10/01/20 0507 10/02/20 0537 10/03/20 0502  10/04/20 0554 10/05/20 0453  WBC 17.0* 31.8* 28.3* 23.8* 22.7* 16.7*  NEUTROABS 15.7*  --   --   --   --   --   HGB 12.0* 11.0* 9.7* 9.5* 9.7* 9.9*  HCT 33.9* 31.5* 28.5* 27.4* 27.3* 28.1*  MCV 82.3 82.7 84.3 83.3 81.7 81.0  PLT 302 224 194 187 191 199      Lab Results  Component Value Date   HEPBSAG NON REACTIVE 10/03/2020   HEPBSAB NON REACTIVE 10/03/2020      Microbiology:  Recent Results (from the past 240 hour(s))  Urine Culture     Status: None   Collection Time: 09/30/20  9:25 PM   Specimen: Urine, Random  Result Value Ref Range Status   Specimen Description   Final    URINE, RANDOM Performed at [facility], [address] [place], [place] [address]    Special Requests   Final    NONE Performed at [facility], [address] [place], [place] [address]    Culture   Final    NO GROWTH Performed at [facility], [address], [place], [place] [address]    Report Status 10/02/2020 FINAL  Final  Resp Panel by RT-PCR (Flu A&B, Covid) Nasopharyngeal Swab     Status: None   Collection Time: 10/01/20 12:23 AM  Specimen: Nasopharyngeal Swab; Nasopharyngeal(NP) swabs in vial transport medium  Result Value Ref Range Status   SARS Coronavirus 2 by RT PCR NEGATIVE NEGATIVE Final    Comment: (NOTE) SARS-CoV-2 target nucleic acids are NOT DETECTED.  The SARS-CoV-2 RNA is generally detectable in upper respiratory specimens during the acute phase of infection. The lowest concentration of SARS-CoV-2 viral copies this assay can detect is 138 copies/mL. A negative result does not preclude SARS-Cov-2 infection and should not be used as the sole basis for treatment or other patient management decisions. A negative result may occur with  improper specimen collection/handling, submission of specimen other than nasopharyngeal swab, presence of viral mutation(s) within the areas targeted by this assay, and inadequate number of viral copies(<138  copies/mL). A negative result must be combined with clinical observations, patient history, and epidemiological information. The expected result is Negative.  Fact Sheet for Patients:  BloggerCourse.com  Fact Sheet for Healthcare Providers:  SeriousBroker.it  This test is no t yet approved or cleared by the [place] FDA and  has been authorized for detection and/or diagnosis of SARS-CoV-2 by FDA under an Emergency Use Authorization (EUA). This EUA will remain  in effect (meaning this test can be used) for the duration of the COVID-19 declaration under Section 564(b)(1) of the Act, 21 U.S.C.section 360bbb-3(b)(1), unless the authorization is terminated  or revoked sooner.       Influenza A by PCR NEGATIVE NEGATIVE Final   Influenza B by PCR NEGATIVE NEGATIVE Final    Comment: (NOTE) The Xpert Xpress SARS-CoV-2/FLU/RSV plus assay is intended as an aid in the diagnosis of influenza from Nasopharyngeal swab specimens and should not be used as a sole basis for treatment. Nasal washings and aspirates are unacceptable for Xpert Xpress SARS-CoV-2/FLU/RSV testing.  Fact Sheet for Patients: BloggerCourse.com  Fact Sheet for Healthcare Providers: SeriousBroker.it  This test is not yet approved or cleared by the [place] FDA and has been authorized for detection and/or diagnosis of SARS-CoV-2 by FDA under an Emergency Use Authorization (EUA). This EUA will remain in effect (meaning this test can be used) for the duration of the COVID-19 declaration under Section 564(b)(1) of the Act, 21 U.S.C. section 360bbb-3(b)(1), unless the authorization is terminated or revoked.  Performed at [facility], [address] [place], [place] [address]   Blood culture (routine x 2)     Status: None (Preliminary result)   Collection Time: 10/01/20 12:36 AM   Specimen: BLOOD   Result Value Ref Range Status   Specimen Description BLOOD  RIGHT HAND  Final   Special Requests   Final    BOTTLES DRAWN AEROBIC AND ANAEROBIC Blood Culture adequate volume   Culture   Final    NO GROWTH 4 DAYS Performed at [facility], [address] [place], [place] [address]    Report Status PENDING  Incomplete  Blood culture (routine x 2)     Status: None (Preliminary result)   Collection Time: 10/01/20 12:57 AM   Specimen: BLOOD  Result Value Ref Range Status   Specimen Description BLOOD LEFT [facility]  Final   Special Requests   Final    BOTTLES DRAWN AEROBIC AND ANAEROBIC Blood Culture adequate volume   Culture   Final    NO GROWTH 4 DAYS Performed at [facility], [address], [place], [place] [address]    Report Status PENDING  Incomplete  Gastrointestinal Panel by PCR , Stool     Status: None  Collection Time: 10/01/20 12:16 PM   Specimen: STOOL  Result Value Ref Range Status   Campylobacter species NOT DETECTED NOT DETECTED Final   Plesimonas shigelloides NOT DETECTED NOT DETECTED Final   Salmonella species NOT DETECTED NOT DETECTED Final   Yersinia enterocolitica NOT DETECTED NOT DETECTED Final   Vibrio species NOT DETECTED NOT DETECTED Final   Vibrio cholerae NOT DETECTED NOT DETECTED Final   Enteroaggregative E coli (EAEC) NOT DETECTED NOT DETECTED Final   Enteropathogenic E coli (EPEC) NOT DETECTED NOT DETECTED Final   Enterotoxigenic E coli (ETEC) NOT DETECTED NOT DETECTED Final   Shiga like toxin producing E coli (STEC) NOT DETECTED NOT DETECTED Final   Shigella/Enteroinvasive E coli (EIEC) NOT DETECTED NOT DETECTED Final   Cryptosporidium NOT DETECTED NOT DETECTED Final   Cyclospora cayetanensis NOT DETECTED NOT DETECTED Final   Entamoeba histolytica NOT DETECTED NOT DETECTED Final   Giardia lamblia NOT DETECTED NOT DETECTED Final   Adenovirus F40/41 NOT DETECTED NOT DETECTED Final   Astrovirus NOT DETECTED NOT DETECTED Final    Norovirus GI/GII NOT DETECTED NOT DETECTED Final   Rotavirus A NOT DETECTED NOT DETECTED Final   Sapovirus (I, II, IV, and V) NOT DETECTED NOT DETECTED Final    Comment: Performed at [facility], [address] [place], [place] [address]  C Difficile Quick Screen w PCR reflex     Status: None   Collection Time: 10/01/20 12:16 PM   Specimen: STOOL  Result Value Ref Range Status   C Diff antigen NEGATIVE NEGATIVE Final   C Diff toxin NEGATIVE NEGATIVE Final   C Diff interpretation No C. difficile detected.  Final    Comment: Performed at [facility], [address] [place], [place] [address]  Resp Panel by RT-PCR (Flu A&B, Covid) Nasopharyngeal Swab     Status: None   Collection Time: 10/03/20  6:40 PM   Specimen: Nasopharyngeal Swab; Nasopharyngeal(NP) swabs in vial transport medium  Result Value Ref Range Status   SARS Coronavirus 2 by RT PCR NEGATIVE NEGATIVE Final    Comment: (NOTE) SARS-CoV-2 target nucleic acids are NOT DETECTED.  The SARS-CoV-2 RNA is generally detectable in upper respiratory specimens during the acute phase of infection. The lowest concentration of SARS-CoV-2 viral copies this assay can detect is 138 copies/mL. A negative result does not preclude SARS-Cov-2 infection and should not be used as the sole basis for treatment or other patient management decisions. A negative result may occur with  improper specimen collection/handling, submission of specimen other than nasopharyngeal swab, presence of viral mutation(s) within the areas targeted by this assay, and inadequate number of viral copies(<138 copies/mL). A negative result must be combined with clinical observations, patient history, and epidemiological information. The expected result is Negative.  Fact Sheet for Patients:  BloggerCourse.comhttps://www.fda.gov/media/152166/download  Fact Sheet for Healthcare Providers:  SeriousBroker.ithttps://www.fda.gov/media/152162/download  This test is no t yet approved  or cleared by the [place] FDA and  has been authorized for detection and/or diagnosis of SARS-CoV-2 by FDA under an Emergency Use Authorization (EUA). This EUA will remain  in effect (meaning this test can be used) for the duration of the COVID-19 declaration under Section 564(b)(1) of the Act, 21 U.S.C.section 360bbb-3(b)(1), unless the authorization is terminated  or revoked sooner.       Influenza A by PCR NEGATIVE NEGATIVE Final   Influenza B by PCR NEGATIVE NEGATIVE Final    Comment: (NOTE) The Xpert Xpress SARS-CoV-2/FLU/RSV plus assay is intended as an aid  in the diagnosis of influenza from Nasopharyngeal swab specimens and should not be used as a sole basis for treatment. Nasal washings and aspirates are unacceptable for Xpert Xpress SARS-CoV-2/FLU/RSV testing.  Fact Sheet for Patients: BloggerCourse.com  Fact Sheet for Healthcare Providers: SeriousBroker.it  This test is not yet approved or cleared by the [place] FDA and has been authorized for detection and/or diagnosis of SARS-CoV-2 by FDA under an Emergency Use Authorization (EUA). This EUA will remain in effect (meaning this test can be used) for the duration of the COVID-19 declaration under Section 564(b)(1) of the Act, 21 U.S.C. section 360bbb-3(b)(1), unless the authorization is terminated or revoked.  Performed at [facility], [address] [place], [place] [address]     Coagulation Studies: Recent Labs    10/04/20 0554  LABPROT 14.1  INR 1.1    Urinalysis: Recent Labs    10/03/20 1415  COLORURINE STRAW*  LABSPEC 1.003*  PHURINE 5.0  GLUCOSEU NEGATIVE  HGBUR LARGE*  BILIRUBINUR NEGATIVE  KETONESUR NEGATIVE  PROTEINUR 100*  NITRITE NEGATIVE  LEUKOCYTESUR TRACE*      Imaging: US BIOPSY (KIDNEY)  Result Date: 10/04/2020 INDICATION: 40 year old male with a history hematuria and proteinuria EXAM: IMAGE GUIDED MEDICAL  RENAL BIOPSY MEDICATIONS: None. ANESTHESIA/SEDATION: Moderate (conscious) sedation was employed during this procedure. A total of Versed 2.0 mg and Fentanyl 100 mcg was administered intravenously. Moderate Sedation Time: 10 minutes. The patient's level of consciousness and vital signs were monitored continuously by radiology nursing throughout the procedure under my direct supervision. FLUOROSCOPY TIME:  Ultrasound COMPLICATIONS: NONE PROCEDURE: Informed written consent was obtained from the patient after a thorough discussion of the procedural risks, benefits and alternatives. All questions were addressed. Maximal Sterile Barrier Technique was utilized including caps, mask, sterile gowns, sterile gloves, sterile drape, hand hygiene and skin antiseptic. A timeout was performed prior to the initiation of the procedure. Patient was positioned prone position on the gantry table. Images were stored sent to PACs. Once the patient is prepped and draped in the usual sterile fashion, the skin and subcutaneous tissues overlying the left kidney were generously infiltrated 1% lidocaine for local anesthesia. Using ultrasound guidance, a 17 gauge guide needle was advanced into the lower cortex of the left kidney. Once we confirmed location of the needle tip, 4 separate 18 gauge core biopsy were achieved. Two Gel-Foam pledgets were infused with a small amount of saline. The needle was removed. Final images were stored. The patient tolerated the procedure well and remained hemodynamically stable throughout. No complications were encountered and no significant blood loss encountered. IMPRESSION: Status post image guided medical renal biopsy. Signed, Yvone Neu. Reyne Dumas, RPVI [facility] Electronically Signed   By: Gilmer Mor D.O.   On: 10/04/2020 13:33   CT TEMPORAL BONES WO CONTRAST  Result Date: 10/03/2020 CLINICAL DATA:  Hearing loss status post cochlear implant.  Fever of unknown origin. Headache. EXAM: CT TEMPORAL BONES WITHOUT CONTRAST TECHNIQUE: Axial and coronal plane CT imaging of the petrous temporal bones was performed with thin-collimation image reconstruction. No intravenous contrast was administered. Multiplanar CT image reconstructions were also generated. COMPARISON:  None. FINDINGS: RIGHT: The external auditory canal is unremarkable. The ossicles appear intact. The tympanic cavity is clear. The internal auditory canal, and semicircular canals are unremarkable. The cochlea is abnormal with a deficient modiolus and absent septation between the middle and apical turns. The vestibular aqueduct is enlarged. The vestibule is also mildly enlarged. The mastoid air  cells are clear. LEFT: Sequelae of canal wall up mastoidectomy are identified. A cochlear implant is in place. There is streak artifact about the external portion of the implant without a gross fluid collection identified. As on the contralateral side, the vestibule and vestibular aqueduct are enlarged. The cochlea is also likely abnormal although the presence of the implant limits assessment. The tympanic cavity and residual mastoid air cells are clear. The ossicles appear intact. The internal auditory canal and semicircular canals are unremarkable. There is mild scattered mucosal thickening in the paranasal sinuses there is a 1 cm bony defect in the posterior wall of the left maxillary sinus without inflammatory changes in the retro antral fat. The visualized portions of the orbits and brain are unremarkable. IMPRESSION: 1. Left cochlear implant in place. No fluid collection or other acute abnormality identified. 2. 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 Electronically Signed   By: Sebastian Ache M.D.   On: 10/03/2020 18:37     Medications:   . cefTRIAXone (ROCEPHIN)  IV Stopped  (10/05/20 0046)   . ferrous sulfate  325 mg Oral Q breakfast  . hydrALAZINE  50 mg Oral Q8H  . irbesartan  150 mg Oral Daily  . melatonin  5 mg Oral QHS   acetaminophen **OR** acetaminophen, hydrALAZINE, ondansetron **OR** ondansetron (ZOFRAN) IV  Assessment/ Plan:  40 y.o. 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  Bladder is unremarkable. Urinalysis October 03, 2020: Large hemoglobin, greater than 100 protein, 11-20 RBCs, 6-10 WBCs UPC 5.56 gm. Renal biopsy 10/04/2020 Hep B s Ag and Ab neg, Hep C Ab neg; HIV neg   Plan: Renal biopsy completed on October 04, 2020 Awaiting preliminary result Improving renal function Appreciate  ID recs Clear to discharge from renal stance Follow up with our office in a week  Late entry: Preliminary Biopsy results indicate Acute Glomerulonephritis with Fibrocellular crescents # Acute Nephritic Syndrome Final results pending Plan Iv Solumedrol 1000 mg iv x 3 days     LOS: 4 Wendee Beavers 4/8/202210:32 AM  [facility] [place], [place] [phone]  Patient was seen and evaluated with Wendee Beavers, NP.  Plan of care was discussed with patient as well as NP.  I agree with the note as documented with edits.

## 2020-10-06 LAB — CULTURE, BLOOD (ROUTINE X 2)
Culture: NO GROWTH
Culture: NO GROWTH
Special Requests: ADEQUATE
Special Requests: ADEQUATE

## 2020-12-13 ENCOUNTER — Encounter: Payer: Self-pay | Admitting: Nephrology

## 2020-12-13 LAB — SURGICAL PATHOLOGY

## 2020-12-20 ENCOUNTER — Ambulatory Visit (LOCAL_COMMUNITY_HEALTH_CENTER): Payer: BC Managed Care – PPO

## 2020-12-20 ENCOUNTER — Other Ambulatory Visit: Payer: Self-pay

## 2020-12-20 DIAGNOSIS — Z23 Encounter for immunization: Secondary | ICD-10-CM | POA: Diagnosis not present

## 2020-12-20 NOTE — Progress Notes (Signed)
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 As requested, Glenna Fellows RN, Immunization and Child Health Coordinator contacted prior to administration of PPSV23. Per Ms. White Charity fundraiser, do not administer PPSV23 today due to pending changes / recommendations / availability of pneumococcal vaccines. Per Ms. White, she will Naval architect regarding administering pneumococcal vaccines in this client (refer to [facility]). As no standing order to administer Bexsero in this situation, call placed to Dr. Alvester Morin. Client counseled regarding above. As need to return to work, client received Tdap and MCV4 vaccine today. After client left clinic, received call from Dr. Alvester Morin who is working today at [facility]. Per her phone order, client may receive Bexsero 0.5cc IM x1. FYI created and note placed on assignment board in Nurse Clinic. Jossie Ng, RN

## 2020-12-24 NOTE — Progress Notes (Signed)
Attestation of Medical Director for clinical support staff: I agree with the care provided to this patient and was available for consultation.  I was consulted at the point of care and documentation reflects my recommendations.   Aubrynn Katona Niles Juanito Gonyer, MD, MPH, ABFM [facility] Medical Director  

## 2020-12-25 ENCOUNTER — Telehealth: Payer: Self-pay | Admitting: Family Medicine

## 2020-12-25 NOTE — Telephone Encounter (Signed)
Patient is hearing impaired. The mother scheduled his appointment, but he could not get the vaccine during his previous visit. He was contacted and told to schedule the visit again since we now have the "approval for the Meningo". Please call the mother if there is any changes or if he needs to do something previous his next appointment on Wed. 7/6th. Thanks.

## 2021-01-02 ENCOUNTER — Ambulatory Visit (LOCAL_COMMUNITY_HEALTH_CENTER): Payer: BC Managed Care – PPO

## 2021-01-02 ENCOUNTER — Other Ambulatory Visit: Payer: Self-pay

## 2021-01-02 DIAGNOSIS — Z23 Encounter for immunization: Secondary | ICD-10-CM | POA: Diagnosis not present

## 2021-01-02 NOTE — Progress Notes (Signed)
Attestation of Medical Director for clinical support staff: I agree with the care provided to this patient and was available for consultation.  I was consulted at the point of care and documentation reflects my recommendations.   Jaymond Waage Niles Donoven Pett, MD, MPH, ABFM [facility] Medical Director  

## 2021-01-02 NOTE — Progress Notes (Signed)
Client presents for Men-B vaccine and Bexsero administered without difficulty per 12/20/2020 order of Dr. Alvester Morin. Client also desires PPSV-23 as > 5 years since last dose and asplenic (since childhood). Counseled regarding recommendation for Prevnar-20 and this vaccine desired by client.  Per phone consult with Dr. Alvester Morin, administer Prevnar-20 0.5 mL IM x1 today. Client tolerated injection without complaint. Jossie Ng, RN

## 2021-05-09 IMAGING — CT CT RENAL STONE PROTOCOL
2 of 4 series · 16 of 46 positions shown, 18 images · non-contrast
Comparison: CT pelvis 08/07/2014

CLINICAL DATA: Hematuria

EXAM:
CT ABDOMEN AND PELVIS WITHOUT CONTRAST
TECHNIQUE: Multidetector CT imaging of the abdomen and pelvis was performed
following the standard protocol without IV contrast.

[Series 2: stone full standard · axial · 0.95mm/px · z∈[-1059,-589]mm · 13 of 104 slices shown, 15 images]
[im 5/104  soft-tissue]
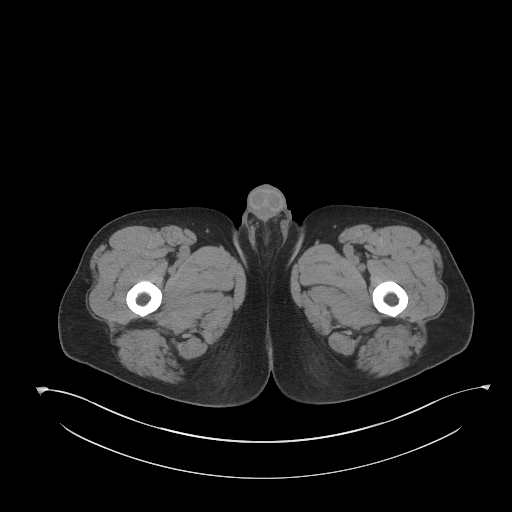
[im 5/104  bone]
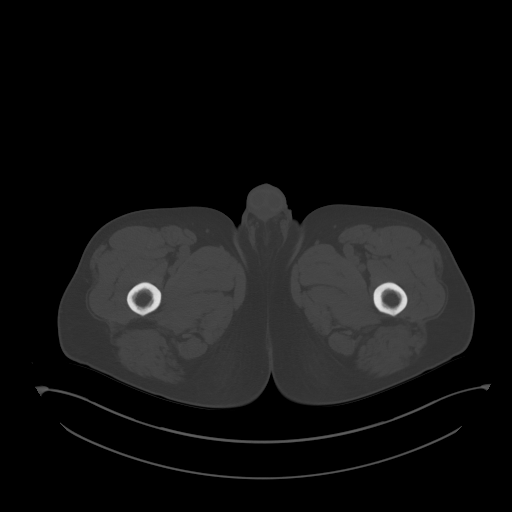
[im 13/104  soft-tissue]
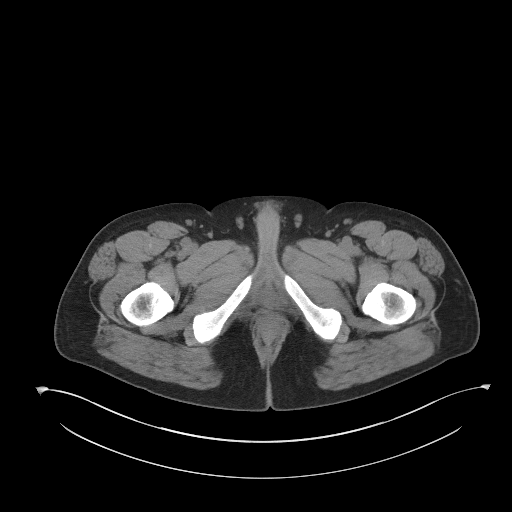
[im 21/104  soft-tissue]
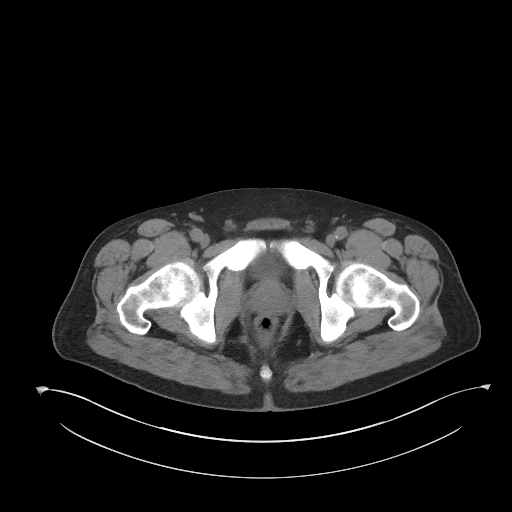
[im 29/104  soft-tissue]
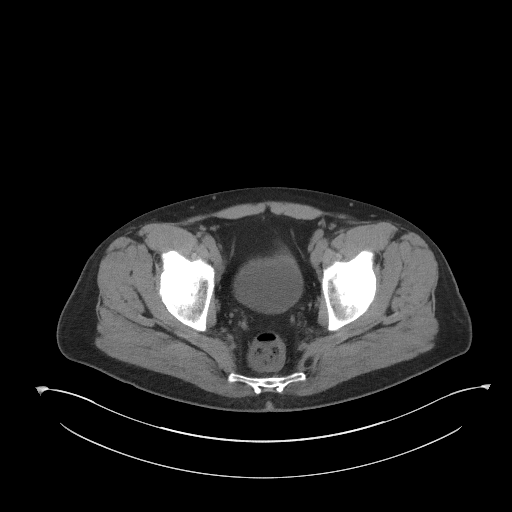
[im 38/104  soft-tissue]
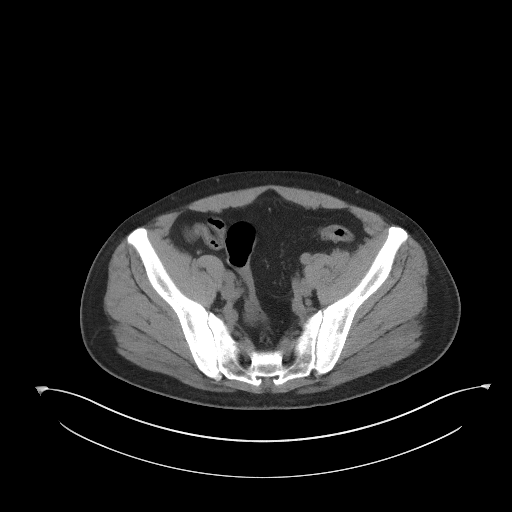
[im 46/104  soft-tissue]
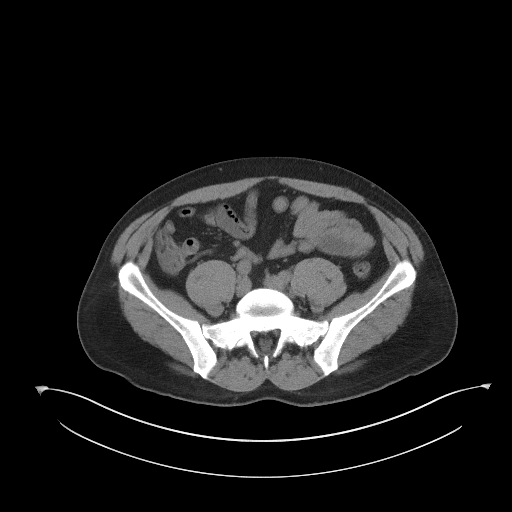
[im 54/104  soft-tissue]
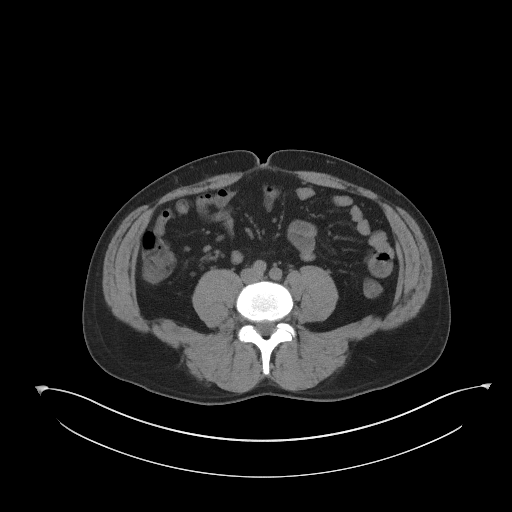
[im 58/104  soft-tissue]
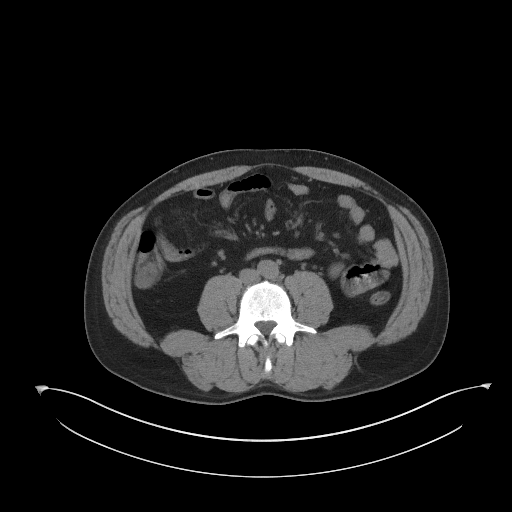
[im 66/104  soft-tissue]
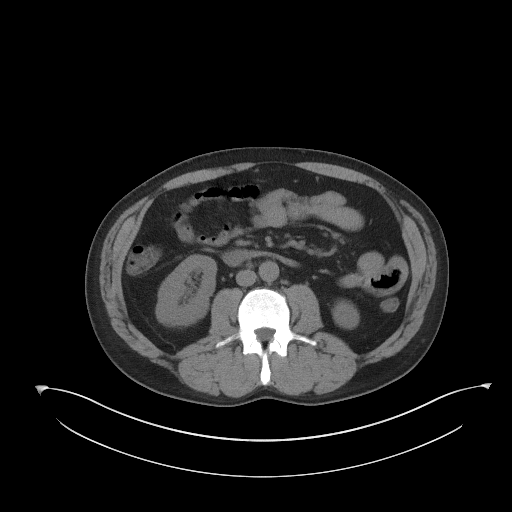
[im 66/104  bone]
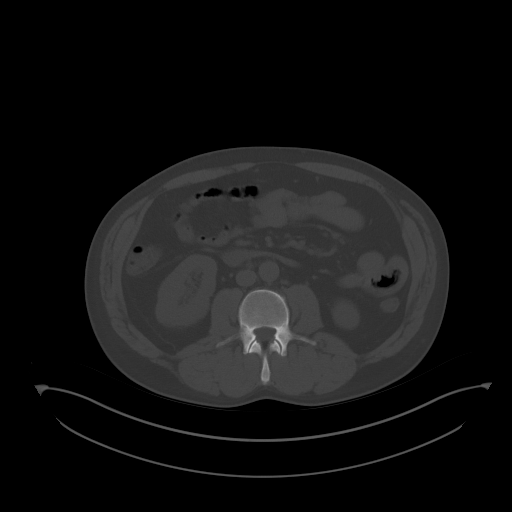
[im 75/104  soft-tissue]
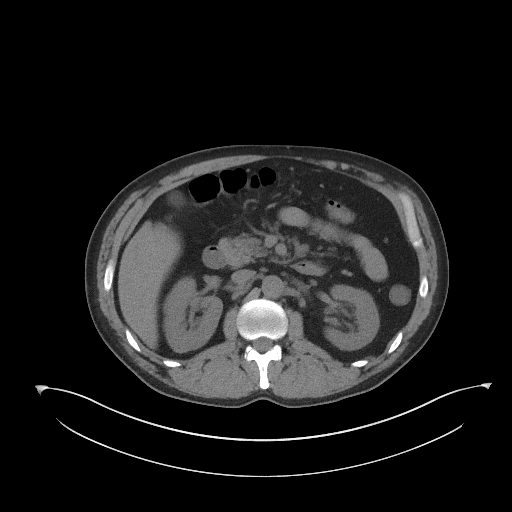
[im 83/104  soft-tissue]
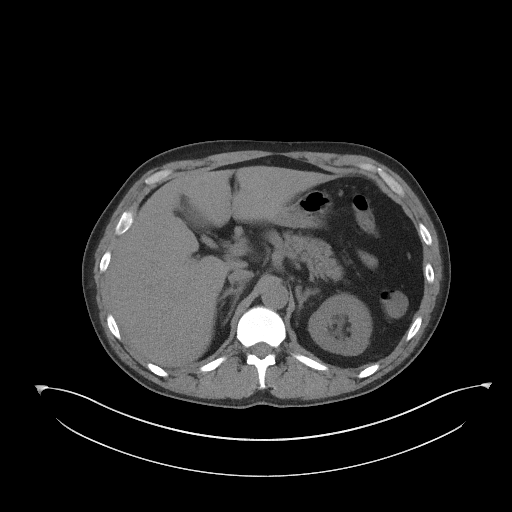
[im 91/104  soft-tissue]
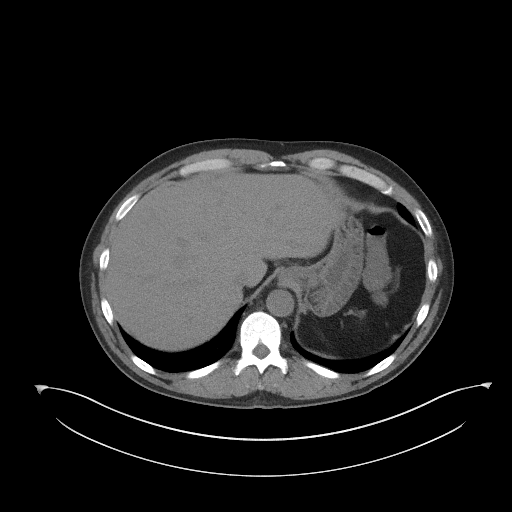
[im 99/104  soft-tissue]
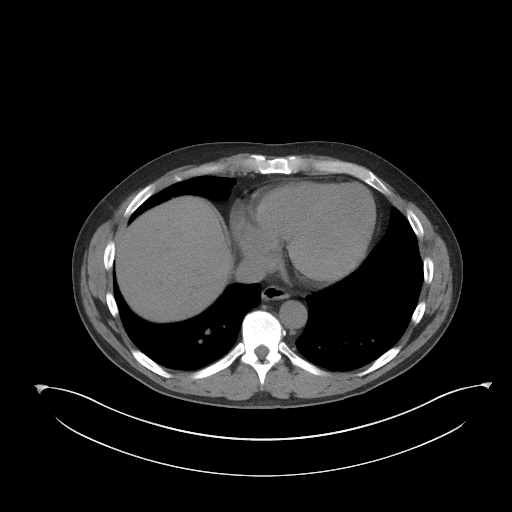

[Series 5: coronal · coronal · 0.81mm/px · 3 of 132 slices shown]
[im 44/132  soft-tissue]
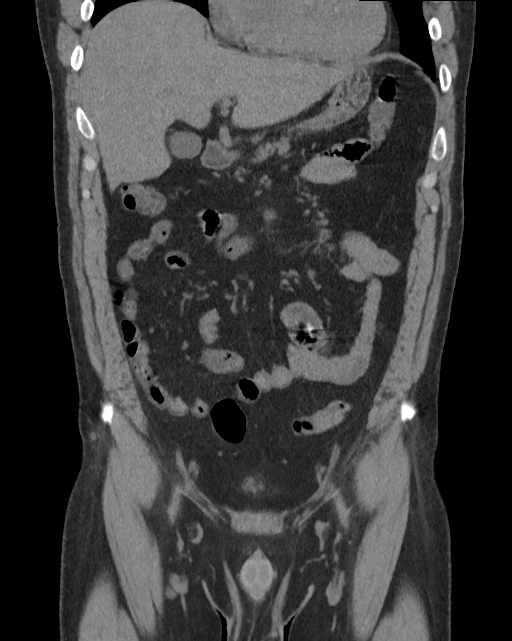
[im 59/132  soft-tissue]
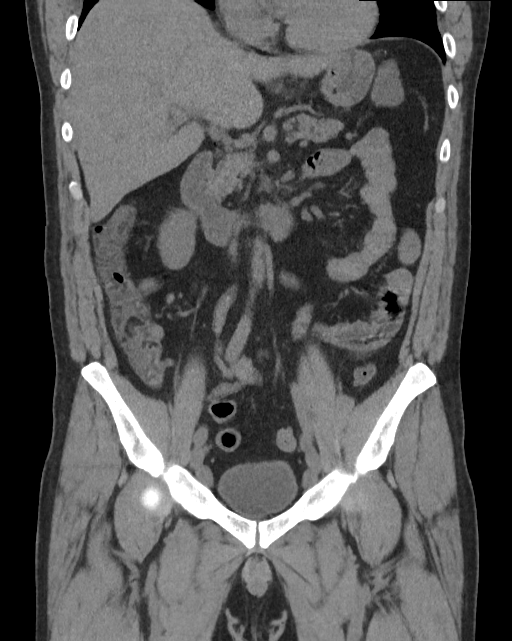
[im 73/132  soft-tissue]
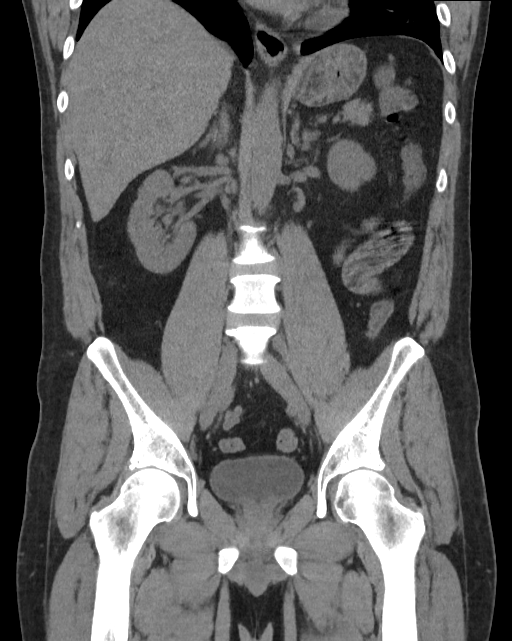

[16 of 46 positions shown; findings below may reference images not displayed]

FINDINGS: Lower chest: The lung bases are clear.

Hepatobiliary: No focal liver abnormality is seen. No gallstones,
gallbladder wall thickening, or biliary dilatation.

Pancreas: Unremarkable. No pancreatic ductal dilatation or
surrounding inflammatory changes.

Spleen: The spleen is absent, either surgically or congenitally.

Adrenals/Urinary Tract: Adrenal glands are unremarkable. Kidneys are
normal, without renal calculi, focal lesion, or hydronephrosis.
Bladder is unremarkable.

Stomach/Bowel: Stomach is within normal limits. Appendix appears
normal. No evidence of bowel wall thickening, distention, or
inflammatory changes.

Vascular/Lymphatic: No significant vascular findings are present. No
enlarged abdominal or pelvic lymph nodes.

Reproductive: Uterus and bilateral adnexa are unremarkable.

Other: No abdominal wall hernia or abnormality. No abdominopelvic
ascites.

Musculoskeletal: No acute or significant osseous findings.
IMPRESSION: No acute process demonstrated in the abdomen or pelvis. No renal or
ureteral stone or obstruction. Absence of the spleen.

## 2021-05-12 IMAGING — US US BIOPSY
1 series · 15 of 22 positions shown · non-contrast
Comparison: none

INDICATION: 39-year-old male with a history hematuria and proteinuria

[Series 1: us biopsy · 15 of 22 slices shown]
[im 1/22]
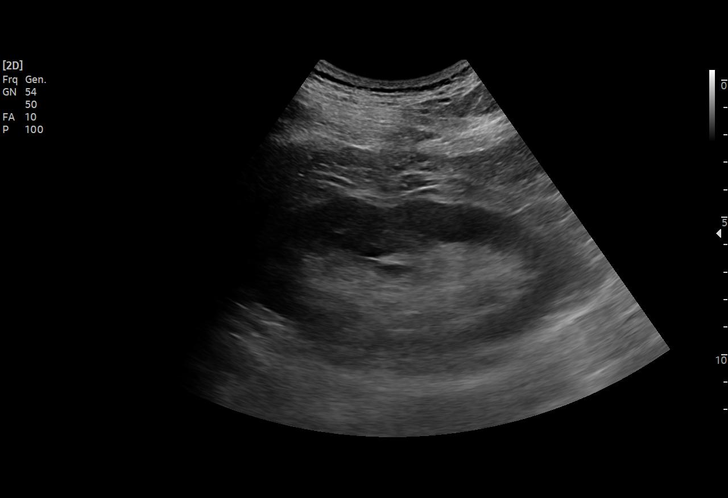
[im 3/22]
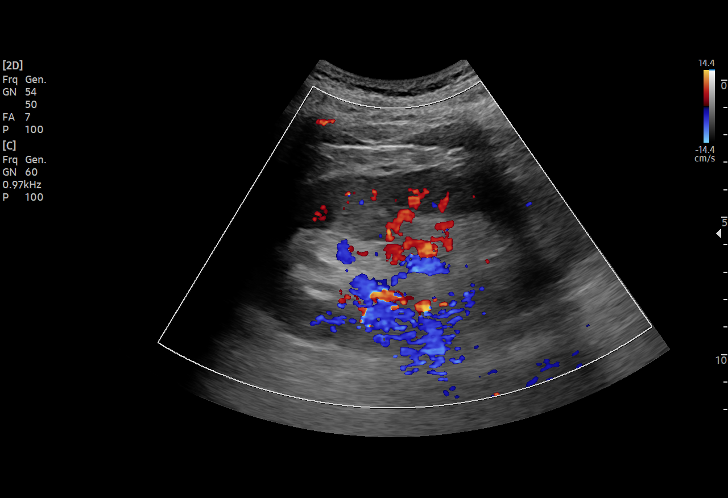
[im 4/22]
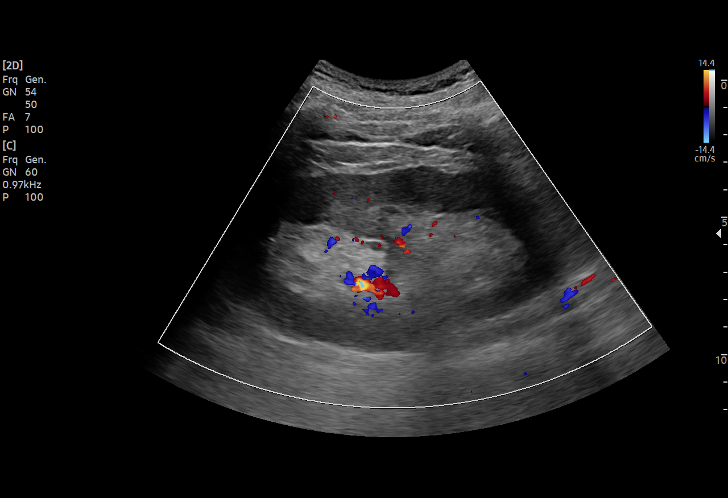
[im 6/22]
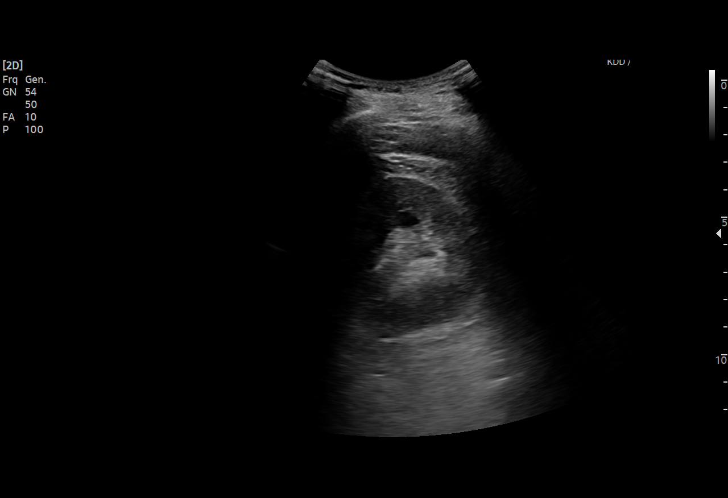
[im 7/22]
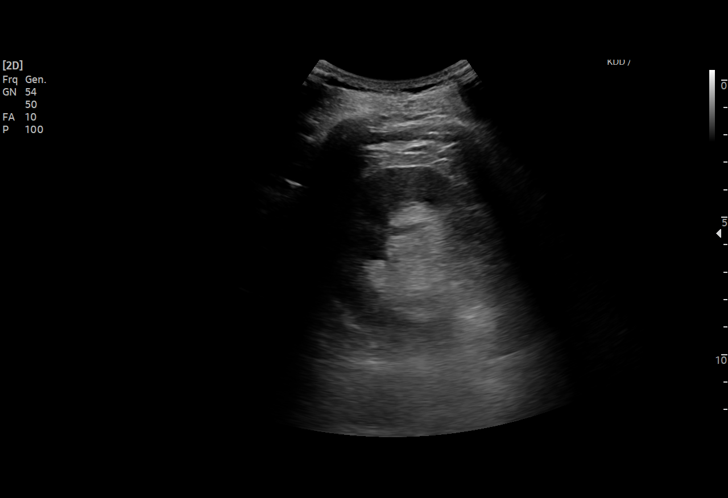
[im 9/22]
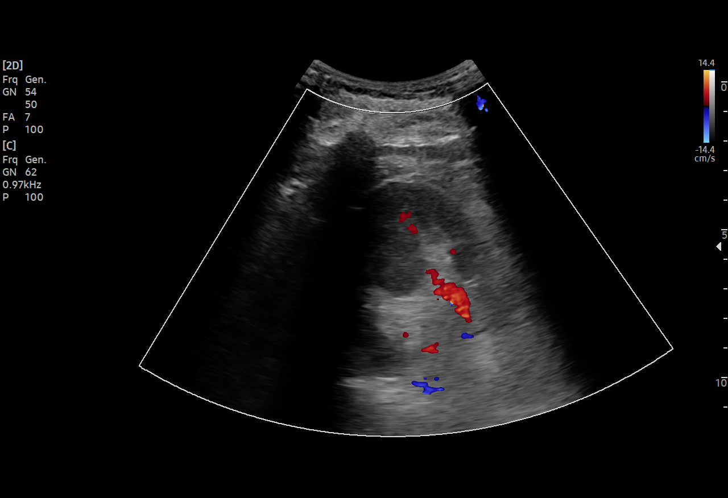
[im 10/22]
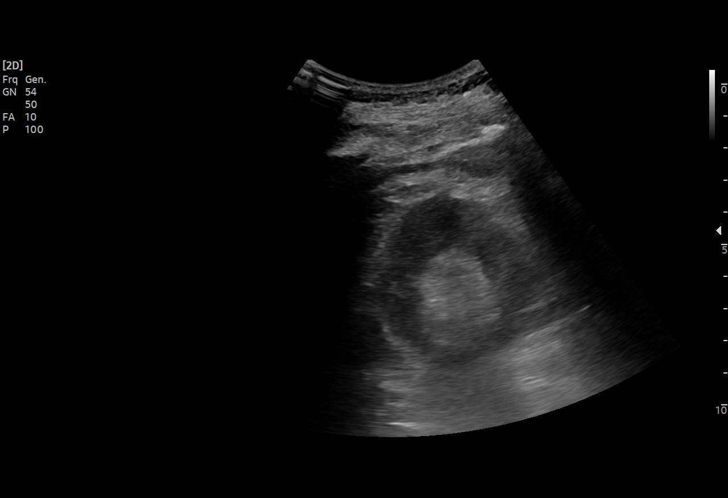
[im 12/22]
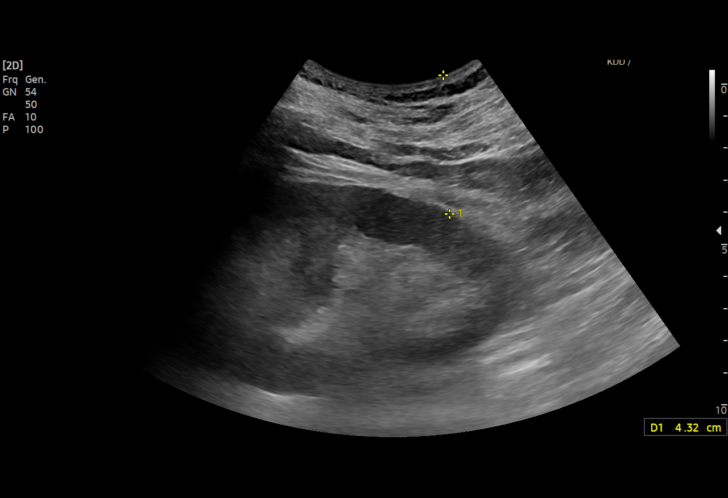
[im 13/22]
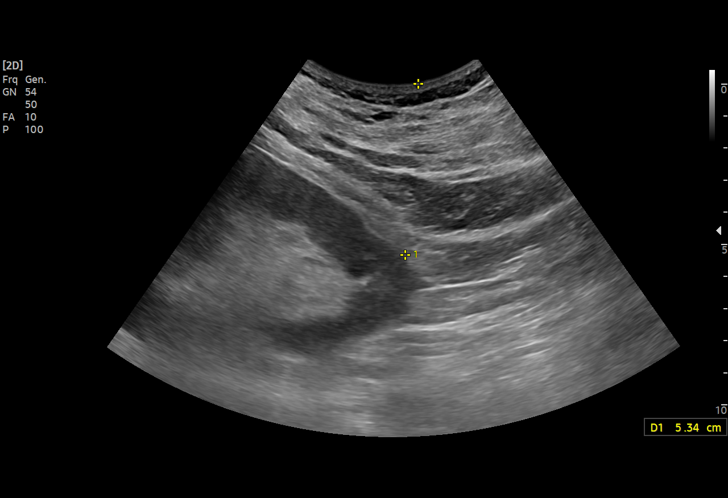
[im 14/22]
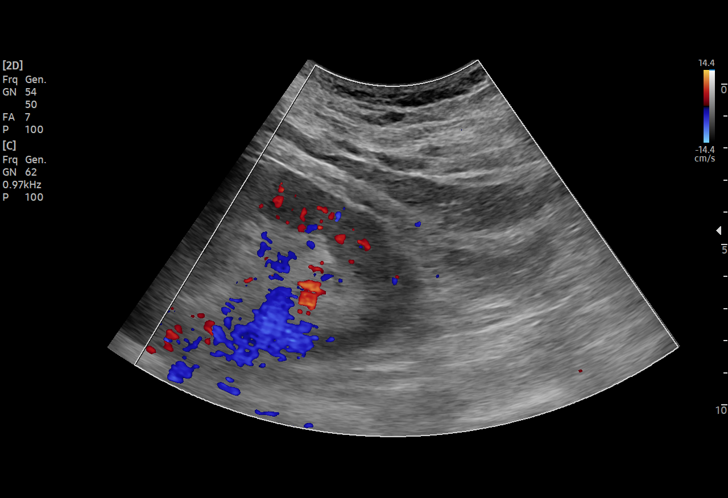
[im 16/22]
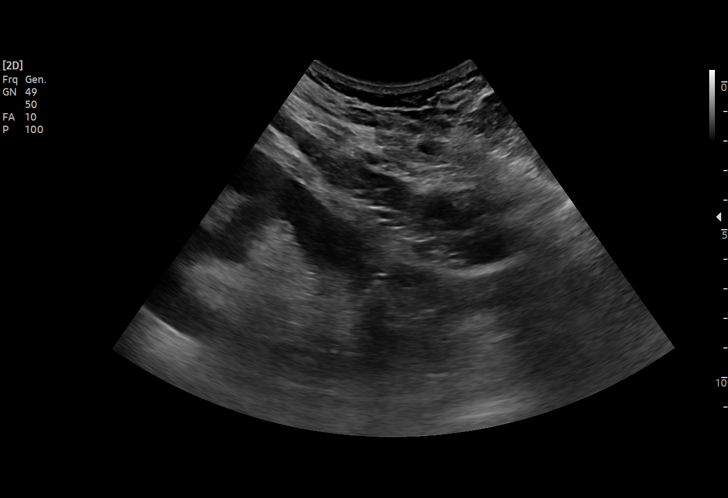
[im 17/22]
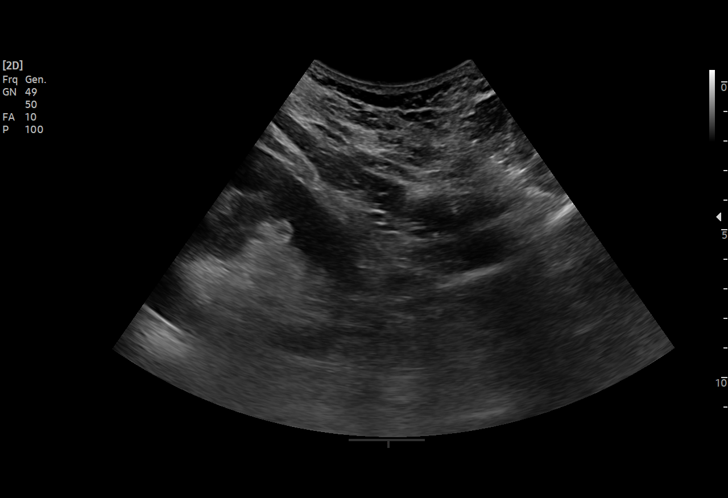
[im 19/22]
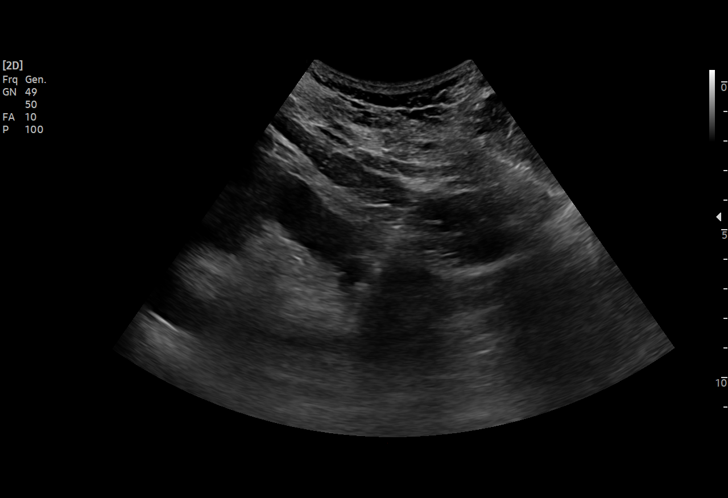
[im 20/22]
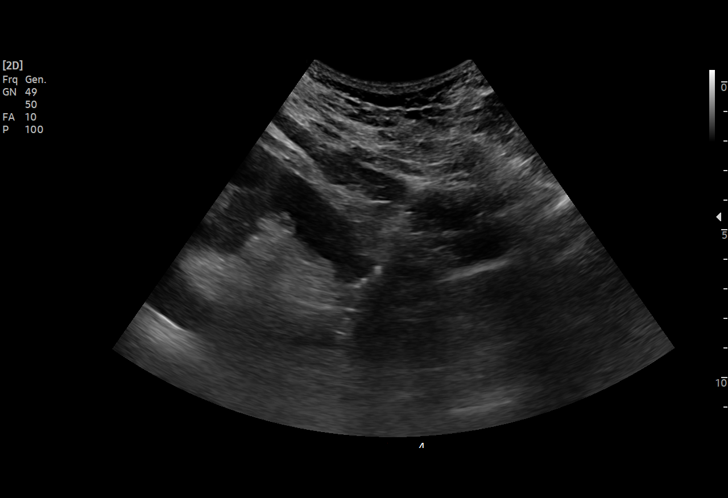
[im 22/22]
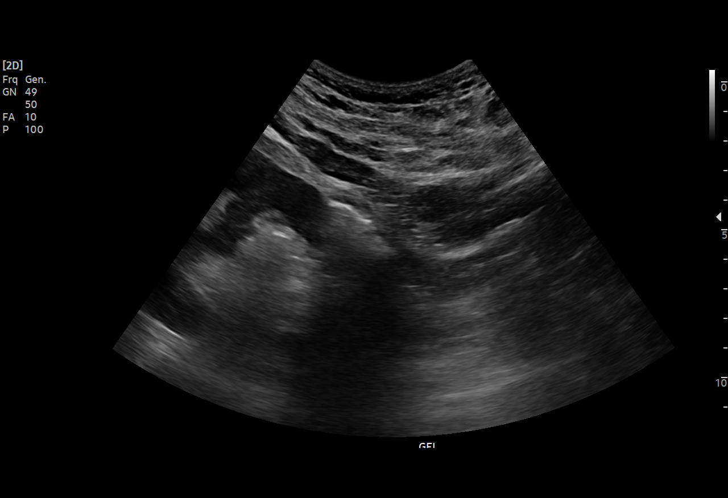

[15 of 22 positions shown; findings below may reference images not displayed]

EXAM:
IMAGE GUIDED MEDICAL RENAL BIOPSY

MEDICATIONS:
None.

ANESTHESIA/SEDATION:
Moderate (conscious) sedation was employed during this procedure. A
total of Versed 2.0 mg and Fentanyl 100 mcg was administered
intravenously.

Moderate Sedation Time: 10 minutes. The patient's level of
consciousness and vital signs were monitored continuously by
radiology nursing throughout the procedure under my direct
supervision.

FLUOROSCOPY TIME:  Ultrasound

COMPLICATIONS:
NONE

PROCEDURE:
Informed written consent was obtained from the patient after a
thorough discussion of the procedural risks, benefits and
alternatives. All questions were addressed. Maximal Sterile Barrier
Technique was utilized including caps, mask, sterile gowns, sterile
gloves, sterile drape, hand hygiene and skin antiseptic. A timeout
was performed prior to the initiation of the procedure.

Patient was positioned prone position on the gantry table. Images
were stored sent to PACs.

Once the patient is prepped and draped in the usual sterile fashion,
the skin and subcutaneous tissues overlying the left kidney were
generously infiltrated 1% lidocaine for local anesthesia.

Using ultrasound guidance, a 17 gauge guide needle was advanced into
the lower cortex of the left kidney.

Once we confirmed location of the needle tip, 4 separate 18 gauge
core biopsy were achieved.

Two Gel-Foam pledgets were infused with a small amount of saline.
The needle was removed.

Final images were stored.

The patient tolerated the procedure well and remained
hemodynamically stable throughout.

No complications were encountered and no significant blood loss
encountered.
IMPRESSION: Status post image guided medical renal biopsy.

## 2022-09-09 ENCOUNTER — Other Ambulatory Visit
Admission: RE | Admit: 2022-09-09 | Discharge: 2022-09-09 | Disposition: A | Discharge status: Home / Self Care | Payer: Self-pay | Source: Ambulatory Visit | Attending: Ophthalmology | Admitting: Ophthalmology

## 2022-09-09 DIAGNOSIS — H2000 Unspecified acute and subacute iridocyclitis: Secondary | ICD-10-CM | POA: Insufficient documentation

## 2022-09-16 LAB — HLA-B27 ANTIGEN: HLA-B27: NEGATIVE

## 2023-07-07 ENCOUNTER — Other Ambulatory Visit: Payer: Self-pay | Admitting: Nephrology

## 2023-07-07 DIAGNOSIS — N00A Acute nephritic syndrome with C3 glomerulonephritis: Secondary | ICD-10-CM

## 2023-07-07 DIAGNOSIS — R809 Proteinuria, unspecified: Secondary | ICD-10-CM

## 2023-07-07 DIAGNOSIS — N02A Recurrent and persistent hematuria with C3 glomerulonephritis: Secondary | ICD-10-CM

## 2023-07-07 DIAGNOSIS — R801 Persistent proteinuria, unspecified: Secondary | ICD-10-CM

## 2023-07-07 DIAGNOSIS — I10 Essential (primary) hypertension: Secondary | ICD-10-CM

## 2023-07-07 DIAGNOSIS — R600 Localized edema: Secondary | ICD-10-CM

## 2023-07-09 ENCOUNTER — Other Ambulatory Visit: Payer: Self-pay | Admitting: Radiology

## 2023-07-09 DIAGNOSIS — N059 Unspecified nephritic syndrome with unspecified morphologic changes: Secondary | ICD-10-CM

## 2023-07-09 NOTE — H&P (Signed)
 Chief Complaint: Patient was seen in consultation today for hematuria and proteinuria  Referring Physician(s): Singh,Harmeet  Supervising Physician: Karalee Beat  Patient Status: [facility] - Out-pt  History of Present Illness: Jeremiah [name] is a 43 y.o. 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 He has been followed by Nephrology for several years due to his kidney disease and he is familiar to IR from a renal biopsy performed 10/04/20. 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  Recent labs show persistent hematuria and proteinuria, [name] creatinine levels and he's had unexpected weight gain.   Interventional Radiology has been asked to evaluate this patient for an image-guided non-focal renal biopsy.  Past Medical History:  Diagnosis Date   Chronic kidney disease    Hypertension     Past Surgical History:  Procedure Laterality Date   COCHLEAR IMPLANT     2000 and 2007   SPLENECTOMY  1988   WISDOM TOOTH EXTRACTION  03/2014    Allergies: Aspirin  Medications: Prior to Admission medications   Medication Sig Start Date End Date Taking? Authorizing Provider  ferrous sulfate  325 (65 FE) MG tablet Take 1 tablet (325 mg total) by mouth daily with breakfast. 10/06/20 01/04/21  [place], Camellia PARAS, DO  hydrALAZINE  (APRESOLINE ) 50 MG tablet Take 1.5 tablets (75 mg total) by mouth every 8 (eight) hours. 10/05/20 01/03/21  [place], Camellia PARAS, DO  irbesartan  (AVAPRO ) 150 MG tablet Take 1 tablet (150 mg total) by mouth daily. 10/06/20 01/04/21  [place], Eric J, DO  tobramycin -dexamethasone  (TOBRADEX ) ophthalmic solution Place 2 drops into both eyes 2 (two) times daily. For 4 days 09/29/20   [provider]     Family History  Problem Relation Age of Onset   Hyperlipidemia Father     Social History    Socioeconomic History   Marital status: Single    Spouse name: Not on file   Number of children: Not on file   Years of education: Not on file   Highest education level: Not on file  Occupational History   Not on file  Tobacco Use   Smoking status: Former    Types: Cigarettes   Smokeless tobacco: Current    Types: Snuff  Substance and Sexual Activity   Alcohol use: Yes    Alcohol/week: 15.0 standard drinks of alcohol    Types: 15 Cans of beer per week    Comment: down to 2or3 beers a week   Drug use: No   Sexual activity: Not on file  Other Topics Concern   Not on file  Social History Narrative   Not on file   Social Drivers of Health   Financial Resource Strain: Low Risk  (03/05/2023)   Received from [facility]   Overall Financial Resource Strain (CARDIA)    Difficulty of Paying Living Expenses: Not hard at all  Food Insecurity: No Food Insecurity (03/05/2023)   Received from [facility]   Hunger Vital Sign    Worried About Running Out of Food in the Last Year: Never true    Ran Out of Food in the Last Year: Never true  Transportation Needs: No Transportation Needs (03/05/2023)   Received from [facility] - Transportation    In the past 12 months, has lack of transportation kept you from medical appointments or from getting  medications?: No    Lack of Transportation (Non-Medical): No  Physical Activity: Not on file  Stress: Not on file  Social Connections: Not on file    Review of Systems: A 12 point ROS discussed and pertinent positives are indicated in the HPI above.  All other systems are negative.  Review of Systems  Constitutional:  Negative for appetite change and fatigue.  Respiratory:  Negative for cough and shortness of breath.   Cardiovascular:  Positive for leg swelling. Negative for chest pain.  Gastrointestinal:  Negative for abdominal pain, diarrhea, nausea and vomiting.  Genitourinary:   Negative for dysuria and flank pain.  Neurological:  Negative for dizziness and headaches.    Vital Signs: BP (!) 152/97   Pulse 85   Temp 98.4 F (36.9 C) (Oral)   Resp 17   Ht 5' 11 (1.803 m)   Wt 209 lb (94.8 kg)   SpO2 97%   BMI 29.15 kg/m   Physical Exam Constitutional:      General: He is not in acute distress.    Appearance: He is not ill-appearing.  HENT:     Mouth/Throat:     Mouth: Mucous membranes are moist.     Pharynx: Oropharynx is clear.  Cardiovascular:     Rate and Rhythm: Normal rate and regular rhythm.     Pulses: Normal pulses.     Heart sounds: Normal heart sounds.  Pulmonary:     Effort: Pulmonary effort is normal.     Breath sounds: Normal breath sounds.  Abdominal:     General: Bowel sounds are normal.     Palpations: Abdomen is soft.     Tenderness: There is no abdominal tenderness.  Musculoskeletal:     Right lower leg: No edema.     Left lower leg: No edema.  Skin:    General: Skin is warm and dry.  Neurological:     Mental Status: He is alert and oriented to person, place, and time.  Psychiatric:        Mood and Affect: Mood normal.        Behavior: Behavior normal.        Thought Content: Thought content normal.        Judgment: Judgment normal.     Imaging: No results found.  Labs:  CBC: No results for input(s): WBC, HGB, HCT, PLT in the last 8760 hours.  COAGS: No results for input(s): INR, APTT in the last 8760 hours.  BMP: No results for input(s): NA, K, CL, CO2, GLUCOSE, BUN, CALCIUM, CREATININE, GFRNONAA, GFRAA in the last 8760 hours.  Invalid input(s): CMP  LIVER FUNCTION TESTS: No results for input(s): BILITOT, AST, ALT, ALKPHOS, PROT, ALBUMIN in the last 8760 hours.  TUMOR MARKERS: No results for input(s): AFPTM, CEA, CA199, CHROMGRNA in the last 8760 hours.  Assessment and Plan:  Acute nephritic syndrome with glomerulonephritis; recurrent/persistent  hematuria and proteinuria: Jeremiah [name]. Jeremiah [name], 43 year old male, presents today to the [facility] Interventional Radiology department for an image-guided non-focal renal biopsy.  Risks and benefits of this procedure were discussed with the patient and/or patient's family including, but not limited to bleeding, infection, damage to adjacent structures or low yield requiring additional tests.  All of the questions were answered and there is agreement to proceed. He has been NPO. He is a full code. He does not take any blood-thinning medications.  Consent signed and in chart.   Thank you for this interesting consult.  I greatly  enjoyed meeting Gurdeep H Grudzien and look forward to participating in their care.  A copy of this report was sent to the requesting provider on this date.  Electronically Signed: Warren Dais, AGACNP-BC 07/10/2023, 9:33 AM   I spent a total of  30 Minutes   in face to face in clinical consultation, greater than 50% of which was counseling/coordinating care for non-focal renal biopsy.

## 2023-07-09 NOTE — Progress Notes (Signed)
 Patient for US  guided Renal Biopsy on Friday 07/10/2023, I called and spoke with the patient's mother, Roselie on the phone and gave pre-procedure instructions. Roselie was made aware to have the patient here at 9a, NPO after MN prior to procedure as well as driver post procedure/recovery/discharge. Roselie stated understanding.  Called 07/07/2023

## 2023-07-10 ENCOUNTER — Other Ambulatory Visit: Payer: Self-pay

## 2023-07-10 ENCOUNTER — Ambulatory Visit
Admission: RE | Admit: 2023-07-10 | Discharge: 2023-07-10 | Disposition: A | Discharge status: Home / Self Care | Payer: BC Managed Care – PPO | Source: Ambulatory Visit | Attending: Nephrology | Admitting: Nephrology

## 2023-07-10 DIAGNOSIS — Z87891 Personal history of nicotine dependence: Secondary | ICD-10-CM | POA: Insufficient documentation

## 2023-07-10 DIAGNOSIS — D693 Immune thrombocytopenic purpura: Secondary | ICD-10-CM | POA: Insufficient documentation

## 2023-07-10 DIAGNOSIS — R809 Proteinuria, unspecified: Secondary | ICD-10-CM | POA: Insufficient documentation

## 2023-07-10 DIAGNOSIS — R801 Persistent proteinuria, unspecified: Secondary | ICD-10-CM

## 2023-07-10 DIAGNOSIS — I129 Hypertensive chronic kidney disease with stage 1 through stage 4 chronic kidney disease, or unspecified chronic kidney disease: Secondary | ICD-10-CM | POA: Diagnosis not present

## 2023-07-10 DIAGNOSIS — N189 Chronic kidney disease, unspecified: Secondary | ICD-10-CM | POA: Diagnosis not present

## 2023-07-10 DIAGNOSIS — R319 Hematuria, unspecified: Secondary | ICD-10-CM | POA: Insufficient documentation

## 2023-07-10 DIAGNOSIS — G4733 Obstructive sleep apnea (adult) (pediatric): Secondary | ICD-10-CM | POA: Diagnosis not present

## 2023-07-10 DIAGNOSIS — I10 Essential (primary) hypertension: Secondary | ICD-10-CM

## 2023-07-10 DIAGNOSIS — N059 Unspecified nephritic syndrome with unspecified morphologic changes: Secondary | ICD-10-CM

## 2023-07-10 DIAGNOSIS — Z9081 Acquired absence of spleen: Secondary | ICD-10-CM | POA: Insufficient documentation

## 2023-07-10 DIAGNOSIS — R600 Localized edema: Secondary | ICD-10-CM

## 2023-07-10 DIAGNOSIS — N02A Recurrent and persistent hematuria with C3 glomerulonephritis: Secondary | ICD-10-CM

## 2023-07-10 DIAGNOSIS — N00A Acute nephritic syndrome with C3 glomerulonephritis: Secondary | ICD-10-CM

## 2023-07-10 HISTORY — DX: Essential (primary) hypertension: I10

## 2023-07-10 HISTORY — DX: Chronic kidney disease, unspecified: N18.9

## 2023-07-10 LAB — PROTIME-INR
INR: 0.9 (ref 0.8–1.2)
Prothrombin Time: 12.7 s (ref 11.4–15.2)

## 2023-07-10 MED ORDER — MIDAZOLAM HCL 2 MG/2ML IJ SOLN
INTRAMUSCULAR | Status: AC | PRN
  Administered 2023-07-10: 1 mg via INTRAVENOUS

## 2023-07-10 MED ORDER — HYDRALAZINE HCL 20 MG/ML IJ SOLN
INTRAMUSCULAR | Status: AC
  Filled 2023-07-10: qty 1

## 2023-07-10 MED ORDER — LIDOCAINE HCL (PF) 1 % IJ SOLN
10.0000 mL | Freq: Once | INTRAMUSCULAR | Status: AC
  Administered 2023-07-10: 10 mL via INTRADERMAL
  Filled 2023-07-10: qty 10

## 2023-07-10 MED ORDER — FENTANYL CITRATE (PF) 100 MCG/2ML IJ SOLN
INTRAMUSCULAR | Status: AC
  Filled 2023-07-10: qty 2

## 2023-07-10 MED ORDER — FENTANYL CITRATE (PF) 100 MCG/2ML IJ SOLN
INTRAMUSCULAR | Status: AC | PRN
  Administered 2023-07-10: 50 ug via INTRAVENOUS

## 2023-07-10 MED ORDER — MIDAZOLAM HCL 2 MG/2ML IJ SOLN
INTRAMUSCULAR | Status: AC
  Filled 2023-07-10: qty 2

## 2023-07-10 MED ORDER — SODIUM CHLORIDE 0.9 % IV SOLN: INTRAVENOUS | Status: DC

## 2023-07-10 MED ORDER — HYDRALAZINE HCL 20 MG/ML IJ SOLN
INTRAMUSCULAR | Status: AC | PRN
  Administered 2023-07-10: 10 mg via INTRAVENOUS

## 2023-07-10 NOTE — Procedures (Signed)
Interventional Radiology Procedure Note ° °Procedure: US guided random renal biopsy ° °Complications: None ° °Estimated Blood Loss: None ° °Recommendations: °- Bedrest x 4 hrs °- DC home ° ° °Signed, ° °Gretel Cantu K. Xandrea Clarey, MD ° ° °

## 2023-07-21 ENCOUNTER — Other Ambulatory Visit: Payer: Self-pay

## 2023-07-21 ENCOUNTER — Encounter: Payer: Self-pay | Admitting: Nephrology

## 2023-07-21 MED ORDER — METHYLPREDNISOLONE SODIUM SUCC 125 MG IJ SOLR
1000.0000 mg | INTRAMUSCULAR | 0 refills | Status: AC
  Filled 2023-07-21 – 2023-07-27 (×2): qty 1, 1d supply, fill #0

## 2023-07-23 ENCOUNTER — Encounter: Payer: Self-pay | Admitting: Nephrology

## 2023-07-27 ENCOUNTER — Ambulatory Visit
Admission: RE | Admit: 2023-07-27 | Discharge: 2023-07-27 | Disposition: A | Discharge status: Home / Self Care | Payer: BC Managed Care – PPO | Source: Ambulatory Visit | Attending: Internal Medicine | Admitting: Internal Medicine

## 2023-07-27 ENCOUNTER — Other Ambulatory Visit: Payer: Self-pay

## 2023-07-27 ENCOUNTER — Other Ambulatory Visit (HOSPITAL_COMMUNITY): Payer: Self-pay

## 2023-07-27 DIAGNOSIS — N017 Rapidly progressive nephritic syndrome with diffuse crescentic glomerulonephritis: Secondary | ICD-10-CM | POA: Insufficient documentation

## 2023-07-27 MED ORDER — CYCLOPHOSPHAMIDE 50 MG PO TABS
100.0000 mg | ORAL_TABLET | Freq: Every day | ORAL | 0 refills | Status: DC
  Filled 2023-07-27 (×2): qty 60, 30d supply, fill #0

## 2023-07-27 MED ORDER — ONDANSETRON HCL 4 MG PO TABS
8.0000 mg | ORAL_TABLET | Freq: Three times a day (TID) | ORAL | 2 refills | Status: AC
  Filled 2023-07-27: qty 40, 7d supply, fill #0

## 2023-07-27 MED ORDER — SODIUM CHLORIDE 0.9 % IV SOLN
1000.0000 mg | Freq: Once | INTRAVENOUS | Status: AC
  Administered 2023-07-27: 1000 mg via INTRAVENOUS
  Filled 2023-07-27: qty 16

## 2023-07-27 NOTE — Progress Notes (Signed)
Specialty Pharmacy Initial Fill Coordination Note  Jeremiah [name] is a 43 y.o. male contacted today regarding initial fill of specialty medication(s) cycloPHOSphamide [name])   Patient requested Delivery   Delivery date: 07/29/23   Verified address: [address]  [place] [address]   Medication will be filled on 07/28/23.   Patient is aware of $124.28 copayment.

## 2023-07-27 NOTE — Progress Notes (Signed)
Specialty Pharmacy Initiation Note   Jeremiah [name] is a 43 y.o. male who will be followed by the specialty pharmacy service for RxSp Other    Review of administration, indication, effectiveness, safety, potential side effects, storage/disposable, and missed dose instructions occurred today for patient's specialty medication(s) cycloPHOSphamide (CYTOXAN)     Patient/Caregiver did not have any additional questions or concerns.   Patient's therapy is appropriate to: Initiate    Goals Addressed             This Visit's Progress    Stabilization of disease       Patient is initiating therapy. Patient will maintain adherence and adhere to provider and/or lab appointments         Otto Herb Specialty Pharmacist

## 2023-07-28 ENCOUNTER — Other Ambulatory Visit (HOSPITAL_COMMUNITY): Payer: Self-pay

## 2023-07-28 ENCOUNTER — Other Ambulatory Visit: Payer: Self-pay

## 2023-07-28 ENCOUNTER — Ambulatory Visit
Admission: RE | Admit: 2023-07-28 | Discharge: 2023-07-28 | Disposition: A | Discharge status: Home / Self Care | Payer: BC Managed Care – PPO | Source: Ambulatory Visit | Attending: Nephrology | Admitting: Nephrology

## 2023-07-28 DIAGNOSIS — N02B5 Recurrent and persistent immunoglobulin A nephropathy with diffuse mesangial proliferative glomerulonephritis: Secondary | ICD-10-CM | POA: Diagnosis present

## 2023-07-28 MED ORDER — SODIUM CHLORIDE 0.9 % IV SOLN
1000.0000 mg | Freq: Once | INTRAVENOUS | Status: AC
  Administered 2023-07-28: 1000 mg via INTRAVENOUS
  Filled 2023-07-28: qty 16

## 2023-07-28 NOTE — Progress Notes (Signed)
07/28/23 CA: Cytoxan  Left voicemail. Unable to add cc to file. Need cc info again.

## 2023-07-28 NOTE — Progress Notes (Signed)
Opened by mistake.

## 2023-07-29 ENCOUNTER — Ambulatory Visit
Admission: RE | Admit: 2023-07-29 | Discharge: 2023-07-29 | Disposition: A | Discharge status: Home / Self Care | Payer: BC Managed Care – PPO | Source: Ambulatory Visit | Attending: Nephrology | Admitting: Nephrology

## 2023-07-29 DIAGNOSIS — N017 Rapidly progressive nephritic syndrome with diffuse crescentic glomerulonephritis: Secondary | ICD-10-CM | POA: Insufficient documentation

## 2023-07-29 MED ORDER — SODIUM CHLORIDE 0.9 % IV SOLN
1000.0000 mg | Freq: Once | INTRAVENOUS | Status: AC
  Administered 2023-07-29: 1000 mg via INTRAVENOUS
  Filled 2023-07-29: qty 16

## 2023-07-30 ENCOUNTER — Other Ambulatory Visit: Payer: Self-pay

## 2023-08-11 ENCOUNTER — Other Ambulatory Visit: Payer: Self-pay

## 2023-08-19 ENCOUNTER — Other Ambulatory Visit: Payer: Self-pay

## 2023-08-24 ENCOUNTER — Other Ambulatory Visit: Payer: Self-pay

## 2023-08-24 ENCOUNTER — Other Ambulatory Visit (HOSPITAL_COMMUNITY): Payer: Self-pay

## 2023-08-25 ENCOUNTER — Other Ambulatory Visit: Payer: Self-pay

## 2023-08-25 NOTE — Progress Notes (Signed)
 Disenrolling - Spoke with patient today and medication was stopped by MD. Cytoxan discontinued in [place].

## 2023-09-07 ENCOUNTER — Encounter: Payer: Self-pay | Admitting: Clinical Medical Laboratory

## 2023-10-15 LAB — SURGICAL PATHOLOGY
